# Patient Record
Sex: Male | Born: 1960
Health system: Southern US, Community
[De-identification: ages and names within clinical notes are randomized; demographics above are authoritative.]

## PROBLEM LIST (undated history)

## (undated) DIAGNOSIS — H9190 Unspecified hearing loss, unspecified ear: Secondary | ICD-10-CM

## (undated) DIAGNOSIS — C801 Malignant (primary) neoplasm, unspecified: Secondary | ICD-10-CM

## (undated) DIAGNOSIS — J189 Pneumonia, unspecified organism: Secondary | ICD-10-CM

## (undated) DIAGNOSIS — F419 Anxiety disorder, unspecified: Secondary | ICD-10-CM

## (undated) DIAGNOSIS — R52 Pain, unspecified: Secondary | ICD-10-CM

## (undated) DIAGNOSIS — M5126 Other intervertebral disc displacement, lumbar region: Secondary | ICD-10-CM

## (undated) DIAGNOSIS — M1990 Unspecified osteoarthritis, unspecified site: Secondary | ICD-10-CM

## (undated) DIAGNOSIS — I10 Essential (primary) hypertension: Secondary | ICD-10-CM

## (undated) HISTORY — PX: OTHER SURGICAL HISTORY: SHX169

## (undated) HISTORY — PX: JOINT REPLACEMENT: SHX530

---

## 2002-04-11 ENCOUNTER — Emergency Department (HOSPITAL_COMMUNITY): Admission: EM | Admit: 2002-04-11 | Discharge: 2002-04-11 | Payer: Self-pay | Admitting: Internal Medicine

## 2005-09-15 ENCOUNTER — Ambulatory Visit (HOSPITAL_COMMUNITY): Admission: RE | Admit: 2005-09-15 | Discharge: 2005-09-15 | Payer: Self-pay | Admitting: Sports Medicine

## 2005-10-14 ENCOUNTER — Ambulatory Visit (HOSPITAL_BASED_OUTPATIENT_CLINIC_OR_DEPARTMENT_OTHER): Admission: RE | Admit: 2005-10-14 | Discharge: 2005-10-14 | Payer: Self-pay | Admitting: Orthopedic Surgery

## 2009-02-01 ENCOUNTER — Emergency Department (HOSPITAL_COMMUNITY): Admission: EM | Admit: 2009-02-01 | Discharge: 2009-02-01 | Payer: Self-pay | Admitting: Emergency Medicine

## 2009-03-31 ENCOUNTER — Inpatient Hospital Stay (HOSPITAL_COMMUNITY): Admission: RE | Admit: 2009-03-31 | Discharge: 2009-04-02 | Payer: Self-pay | Admitting: Orthopedic Surgery

## 2009-06-24 ENCOUNTER — Ambulatory Visit (HOSPITAL_COMMUNITY): Admission: RE | Admit: 2009-06-24 | Discharge: 2009-06-24 | Payer: Self-pay | Admitting: Family Medicine

## 2010-01-21 ENCOUNTER — Encounter: Payer: Self-pay | Admitting: Gastroenterology

## 2010-02-09 ENCOUNTER — Ambulatory Visit: Payer: Self-pay | Admitting: Gastroenterology

## 2010-02-09 ENCOUNTER — Ambulatory Visit (HOSPITAL_COMMUNITY): Admission: RE | Admit: 2010-02-09 | Discharge: 2010-02-09 | Payer: Self-pay | Admitting: Gastroenterology

## 2010-02-18 ENCOUNTER — Emergency Department (HOSPITAL_COMMUNITY): Admission: EM | Admit: 2010-02-18 | Discharge: 2010-02-18 | Payer: Self-pay | Admitting: Emergency Medicine

## 2010-05-21 ENCOUNTER — Encounter: Payer: Self-pay | Admitting: Pulmonary Disease

## 2010-05-21 ENCOUNTER — Ambulatory Visit (HOSPITAL_COMMUNITY)
Admission: RE | Admit: 2010-05-21 | Discharge: 2010-05-21 | Payer: Self-pay | Source: Home / Self Care | Attending: Family Medicine | Admitting: Family Medicine

## 2010-07-03 DIAGNOSIS — E785 Hyperlipidemia, unspecified: Secondary | ICD-10-CM | POA: Insufficient documentation

## 2010-07-03 DIAGNOSIS — I10 Essential (primary) hypertension: Secondary | ICD-10-CM | POA: Insufficient documentation

## 2010-07-03 DIAGNOSIS — L259 Unspecified contact dermatitis, unspecified cause: Secondary | ICD-10-CM | POA: Insufficient documentation

## 2010-07-06 ENCOUNTER — Ambulatory Visit
Admission: RE | Admit: 2010-07-06 | Discharge: 2010-07-06 | Payer: Self-pay | Source: Home / Self Care | Attending: Pulmonary Disease | Admitting: Pulmonary Disease

## 2010-07-06 DIAGNOSIS — J984 Other disorders of lung: Secondary | ICD-10-CM | POA: Insufficient documentation

## 2010-07-16 NOTE — Letter (Signed)
Summary: Internal Other Domingo Dimes  Internal Other Domingo Dimes   Imported By: Cloria Spring LPN 82/95/6213 08:65:78  _____________________________________________________________________  External Attachment:    Type:   Image     Comment:   External Document

## 2010-07-30 NOTE — Assessment & Plan Note (Signed)
Summary: consult for pulmonary nodule   Copy to:  Assunta Found Primary Provider/Referring Provider:  Assunta Found  CC:  Pulmonary Consult.  History of Present Illness: The pt is a 50y/o male who I have been asked to see for an abnormal ct chest.  He was noted to have a pulmonary nodule in his RLL by ct chest in 02/2010, and this has been followed up with another ct last month that shows the nodule to be without change.  The pt does have a history of smoking a ppd all of his life, and continues to do so.  He does not have any personal history of cancer.  He has no history of TB exposure, and has never had a positive ppd.  He has never lived in [place] or [place].  He denies any weight loss, chest pain, or hemoptysis.  He has a typical "smokers cough".    Preventive Screening-Counseling & Management  Alcohol-Tobacco     Smoking Status: current  Current Medications (verified): 1)  Xanax 1 Mg Tabs (Alprazolam) .... Take 1/2 Tab By Mouth At Bedtime 2)  Celebrex 200 Mg Caps (Celecoxib) .... Take 1 Tablet By Mouth Once A Day As Needed 3)  Naproxen 500 Mg Tabs (Naproxen) .... Take 1 Tablet By Mouth Two Times A Day As Needed 4)  Benicar Hct 20-12.5 Mg Tabs (Olmesartan Medoxomil-Hctz) .... Take 1 Tablet By Mouth Once A Day  Allergies (verified): 1)  ! * Statin  Past History:  Past Medical History: h/o pna complicated by effusion mid 90's..s/p left vats HYPERTENSION (ICD-401.9) HYPERLIPIDEMIA (ICD-272.4) ECZEMA (ICD-692.9)    Past Surgical History: R shoulder surgery L VATS---per pt due to pna/effusion R hand surgery-thumb L knee surgery Oct 2011  Family History: Reviewed history and no changes required. heart disease: father (m.i.) ---deceased cancer: mother (kidney, breast, stomach) ----deceased  Social History: Reviewed history and no changes required. Patient is a current smoker.  started at age 50.  1 ppd.   pt is married and lives with wife. pt has children. pt works  as a Naval architect.  Smoking Status:  current  Review of Systems       The patient complains of nasal congestion/difficulty breathing through nose and joint stiffness or pain.  The patient denies shortness of breath with activity, shortness of breath at rest, productive cough, non-productive cough, coughing up blood, chest pain, irregular heartbeats, acid heartburn, indigestion, loss of appetite, weight change, abdominal pain, difficulty swallowing, sore throat, tooth/dental problems, headaches, sneezing, itching, ear ache, anxiety, depression, hand/feet swelling, rash, change in color of mucus, and fever.    Vital Signs:  Patient profile:   50 year old male Height:      74 inches Weight:      279.13 pounds BMI:     35.97 O2 Sat:      97 % on Room air Temp:     97.8 degrees F oral Pulse rate:   94 / minute BP sitting:   118 / 80  (left arm) Cuff size:   large  Vitals Entered By: Arman Filter LPN (July 06, 2010 2:32 PM)  O2 Flow:  Room air CC: Pulmonary Consult Comments Medications reviewed with patient Arman Filter LPN  July 06, 2010 2:35 PM    Physical Exam  General:  obese male in nad Eyes:  PERRLA and EOMI.   Nose:  patent without discharge, no purulence seen Mouth:  elongated soft palate and uvula, no exudates. Neck:  no jvd, tmg,  LN Lungs:  totally clear to auscultation Heart:  rrr, no mrg Abdomen:  soft and nontender, bs+ Extremities:  mild edema, no cyanosis pulses intact distally Neurologic:  alert and oriented, moves all 4.   Impression & Recommendations:  Problem # 1:  PULMONARY NODULE, RIGHT LOWER LOBE (ICD-518.89) the pt has a nodule in the RLL that has not changed on his 3 month f/u ct chest.  He is a lifelong smoker, and this needs to be followed very carefully.  He understands this may be benign or may be malignant.  It is borderline size for accuracy with PET scanning.  In  its current state, it would be difficult to bx bronchoscopically or  percutaneously, and I think it is premature to remove it at this point.  It is encouraging the nodule has not changed in 3 mos, but would recommend another followup in 6mos.  The pt is agreeable to this approach, but will call if questions come up or if he wishes to pursue more aggressively.    Medications Added to Medication List This Visit: 1)  Xanax 1 Mg Tabs (Alprazolam) .... Take 1/2 tab by mouth at bedtime  Other Orders: Consultation Level IV (16109)  Patient Instructions: 1)  will need to repeat ct chest first week of June.  Please call me in May to set up.   2)  call if change in your condition, or if you have questions to discuss.

## 2010-08-27 LAB — CBC
HCT: 40.8 % (ref 39.0–52.0)
RDW: 14.7 % (ref 11.5–15.5)
WBC: 6.1 10*3/uL (ref 4.0–10.5)

## 2010-08-27 LAB — BASIC METABOLIC PANEL
BUN: 13 mg/dL (ref 6–23)
GFR calc non Af Amer: >60 mL/min (ref >60)
Potassium: 4 mEq/L (ref 3.5–5.1)
Sodium: 141 mEq/L (ref 135–145)

## 2010-08-27 LAB — DIFFERENTIAL
Basophils Absolute: 0 10*3/uL (ref 0.0–0.1)
Lymphocytes Relative: 38 % (ref 12–46)
Monocytes Absolute: 0.6 10*3/uL (ref 0.1–1.0)
Neutro Abs: 3 10*3/uL (ref 1.7–7.7)

## 2010-08-27 LAB — POCT CARDIAC MARKERS
CKMB, poc: 1.3 ng/mL (ref 1.0–8.0)
Myoglobin, poc: 97.8 ng/mL (ref 12–200)

## 2010-08-27 LAB — D-DIMER, QUANTITATIVE: D-Dimer, Quant: 0.94 ug/mL-FEU — ABNORMAL HIGH (ref 0.00–0.48)

## 2010-09-17 LAB — BASIC METABOLIC PANEL
BUN: 10 mg/dL (ref 6–23)
BUN: 11 mg/dL (ref 6–23)
CO2: 31 mEq/L (ref 19–32)
CO2: 32 mEq/L (ref 19–32)
Calcium: 8.6 mg/dL (ref 8.4–10.5)
Calcium: 8.9 mg/dL (ref 8.4–10.5)
Chloride: 102 mEq/L (ref 96–112)
GFR calc non Af Amer: >60 mL/min (ref >60)
GFR calc non Af Amer: >60 mL/min (ref >60)
Glucose, Bld: 114 mg/dL — ABNORMAL HIGH (ref 70–99)
Glucose, Bld: 99 mg/dL (ref 70–99)
Potassium: 4.9 mEq/L (ref 3.5–5.1)
Potassium: 4 mEq/L (ref 3.5–5.1)
Sodium: 138 mEq/L (ref 135–145)
Sodium: 138 mEq/L (ref 135–145)

## 2010-09-17 LAB — URINALYSIS, ROUTINE W REFLEX MICROSCOPIC
Glucose, UA: NEGATIVE mg/dL
Ketones, ur: NEGATIVE mg/dL
Nitrite: NEGATIVE
Protein, ur: NEGATIVE mg/dL
Urobilinogen, UA: 1 mg/dL (ref 0.0–1.0)

## 2010-09-17 LAB — TYPE AND SCREEN: Antibody Screen: NEGATIVE

## 2010-09-17 LAB — CBC
HCT: 32.7 % — ABNORMAL LOW (ref 39.0–52.0)
HCT: 42.3 % (ref 39.0–52.0)
Hemoglobin: 11.3 g/dL — ABNORMAL LOW (ref 13.0–17.0)
MCHC: 33.4 g/dL (ref 30.0–36.0)
MCHC: 34.4 g/dL (ref 30.0–36.0)
MCV: 87.3 fL (ref 78.0–100.0)
Platelets: 173 10*3/uL (ref 150–400)
Platelets: 202 10*3/uL (ref 150–400)
RDW: 14.2 % (ref 11.5–15.5)
RDW: 14.6 % (ref 11.5–15.5)
RDW: 13.7 % (ref 11.5–15.5)
WBC: 7.5 10*3/uL (ref 4.0–10.5)

## 2010-09-17 LAB — DIFFERENTIAL
Basophils Absolute: 0.1 10*3/uL (ref 0.0–0.1)
Eosinophils Relative: 3 % (ref 0–5)
Lymphocytes Relative: 37 % (ref 12–46)
Lymphs Abs: 2.8 10*3/uL (ref 0.7–4.0)
Neutro Abs: 3.9 10*3/uL (ref 1.7–7.7)

## 2010-09-17 LAB — PROTIME-INR
INR: 0.85 (ref 0.00–1.49)
Prothrombin Time: 11.5 seconds — ABNORMAL LOW (ref 11.6–15.2)

## 2010-09-17 LAB — APTT: aPTT: 28 seconds (ref 24–37)

## 2010-10-30 NOTE — Op Note (Signed)
NAMEDIANE, MOCHIZUKI NO.:  [id number]   MEDICAL RECORD NO.:  [id number]          PATIENT TYPE:  AMB   LOCATION:  DSC                          FACILITY:  [facility]   PHYSICIAN:  Loreta Ave, M.D. DATE OF BIRTH:  1961-02-07   DATE OF PROCEDURE:  10/14/2005  DATE OF DISCHARGE:                                 OPERATIVE REPORT   INDICATIONS FOR PROCEDURE:  A 50 year old male with torn cartilage and  arthritis of the left knee.  Brought to the operating room at this time for  arthroscopic debridement.   PREOPERATIVE DIAGNOSIS:  Left knee medial meniscal tear, chondromalacia  patella.   POSTOPERATIVE DIAGNOSIS:  Left knee medial meniscal tear, chondromalacia  patella with extensive grade 4 chondromalacia patellofemoral joint as well  as grade 2 and 3 chondromalacia of medial and lateral compartments.   OPERATION PERFORMED:  Left knee examination under anesthesia, arthroscopy,  partial medial meniscectomy.  Extensive chondroplasty of patellofemoral  joint and lesser extent other compartments.  Removal of numerous chondral  loose bodies.   SURGEON:  Loreta Ave, M.D.   ASSISTANT:  Genene Churn. Denton Meek.   ANESTHESIA:  General.   ESTIMATED BLOOD LOSS:  Minimal.   SPECIMENS:  None.   CULTURES:  None.   COMPLICATIONS:  None.   DRESSING:  Soft compressive.   TOURNIQUET:  Not employed.   DESCRIPTION OF PROCEDURE:  The patient was brought to the operating room and  placed on the operating table in supine position.  After adequate anesthesia  had been obtained, the knee examined.  2+ reactive infusion.  Tourniquet and  leg holder applied.  Leg prepped and draped in the usual sterile fashion.  Three portals were created, one superolateral, one each medial and lateral  parapatellar.  Inflow catheter introduced.  Knee distended. Arthroscope  introduced.  Extensive grade 3 and grade 4 changes over the entire  patellofemoral joint, bone-on-bone over the lateral  half.  Chondroplasty of  the remaining surfaces.  Numerous chondral loose bodies throughout the knee  debrided.  Cruciate ligaments intact.  Medial and lateral compartments grade  2 and 3 changes on the condyles debrided.  Lateral meniscus intact.  Medial  meniscus complex tearing posterior half taken down to stable rim tapered  into remaining meniscus.  At completion, the entire knee examined top be  sure all loose fragments removed.  Instruments and fluid removed.  Portals of the knee injected with Marcaine.  Portals were closed with 4-0 nylon.  Sterile compressive dressing applied.  Anesthesia reversed.  Brought to recovery room.  Tolerated surgery well.  No  complications.      Loreta Ave, M.D.  Electronically Signed     DFM/MEDQ  D:  10/14/2005  T:  10/15/2005  Job:  [id number]

## 2010-12-15 ENCOUNTER — Telehealth: Payer: Self-pay | Admitting: *Deleted

## 2010-12-15 DIAGNOSIS — R911 Solitary pulmonary nodule: Secondary | ICD-10-CM

## 2010-12-15 NOTE — Telephone Encounter (Signed)
Message copied by Salli Quarry on Tue Dec 15, 2010  5:44 PM ------      Message from: Salli Quarry      Created: Mon Sep 14, 2010  9:33 AM       F/u ct chest for RLL nodule

## 2010-12-18 ENCOUNTER — Encounter: Payer: Self-pay | Admitting: *Deleted

## 2010-12-24 ENCOUNTER — Ambulatory Visit (INDEPENDENT_AMBULATORY_CARE_PROVIDER_SITE_OTHER)
Admission: RE | Admit: 2010-12-24 | Discharge: 2010-12-24 | Disposition: A | Discharge status: Home / Self Care | Payer: Commercial Managed Care - PPO | Source: Ambulatory Visit | Attending: Pulmonary Disease | Admitting: Pulmonary Disease

## 2010-12-24 DIAGNOSIS — J984 Other disorders of lung: Secondary | ICD-10-CM

## 2010-12-24 DIAGNOSIS — R911 Solitary pulmonary nodule: Secondary | ICD-10-CM

## 2010-12-31 ENCOUNTER — Telehealth: Payer: Self-pay | Admitting: Pulmonary Disease

## 2010-12-31 NOTE — Telephone Encounter (Signed)
Please let pt know that his spot is a now more scar like than a rounded spot. This is good news. Has not enlarged. I would recommend one more followup in one year. Have him call us next summer to schedule.      LMTCB

## 2011-01-01 NOTE — Telephone Encounter (Signed)
Called and spoke with pt.  Pt aware of Ct results. Pt verbalized understanding and denied any questions.

## 2011-01-25 ENCOUNTER — Telehealth: Payer: Self-pay | Admitting: Pulmonary Disease

## 2011-01-25 NOTE — Telephone Encounter (Signed)
Error.  Ct already done

## 2012-02-28 ENCOUNTER — Other Ambulatory Visit (HOSPITAL_COMMUNITY): Payer: Self-pay | Admitting: Chiropractic Medicine

## 2012-02-28 DIAGNOSIS — M5430 Sciatica, unspecified side: Secondary | ICD-10-CM

## 2012-02-29 ENCOUNTER — Ambulatory Visit (HOSPITAL_COMMUNITY)
Admission: RE | Admit: 2012-02-29 | Discharge: 2012-02-29 | Disposition: A | Discharge status: Home / Self Care | Payer: BC Managed Care – PPO | Source: Ambulatory Visit | Attending: Chiropractic Medicine | Admitting: Chiropractic Medicine

## 2012-02-29 DIAGNOSIS — M5430 Sciatica, unspecified side: Secondary | ICD-10-CM

## 2012-02-29 DIAGNOSIS — M545 Low back pain, unspecified: Secondary | ICD-10-CM | POA: Insufficient documentation

## 2012-02-29 DIAGNOSIS — M51379 Other intervertebral disc degeneration, lumbosacral region without mention of lumbar back pain or lower extremity pain: Secondary | ICD-10-CM | POA: Insufficient documentation

## 2012-02-29 DIAGNOSIS — M5126 Other intervertebral disc displacement, lumbar region: Secondary | ICD-10-CM | POA: Insufficient documentation

## 2012-02-29 DIAGNOSIS — M5137 Other intervertebral disc degeneration, lumbosacral region: Secondary | ICD-10-CM | POA: Insufficient documentation

## 2013-07-11 ENCOUNTER — Other Ambulatory Visit (HOSPITAL_COMMUNITY): Payer: Self-pay | Admitting: Neurosurgery

## 2013-07-11 DIAGNOSIS — M4120 Other idiopathic scoliosis, site unspecified: Secondary | ICD-10-CM

## 2013-07-13 ENCOUNTER — Ambulatory Visit (HOSPITAL_COMMUNITY)
Admission: RE | Admit: 2013-07-13 | Discharge: 2013-07-13 | Disposition: A | Discharge status: Home / Self Care | Payer: BC Managed Care – PPO | Source: Ambulatory Visit | Attending: Neurosurgery | Admitting: Neurosurgery

## 2013-07-13 DIAGNOSIS — M48061 Spinal stenosis, lumbar region without neurogenic claudication: Secondary | ICD-10-CM | POA: Insufficient documentation

## 2013-07-13 DIAGNOSIS — M5137 Other intervertebral disc degeneration, lumbosacral region: Secondary | ICD-10-CM | POA: Insufficient documentation

## 2013-07-13 DIAGNOSIS — M5126 Other intervertebral disc displacement, lumbar region: Secondary | ICD-10-CM | POA: Insufficient documentation

## 2013-07-13 DIAGNOSIS — M51379 Other intervertebral disc degeneration, lumbosacral region without mention of lumbar back pain or lower extremity pain: Secondary | ICD-10-CM | POA: Insufficient documentation

## 2013-07-13 DIAGNOSIS — M4120 Other idiopathic scoliosis, site unspecified: Secondary | ICD-10-CM | POA: Insufficient documentation

## 2013-08-07 ENCOUNTER — Ambulatory Visit (HOSPITAL_COMMUNITY)
Admission: RE | Admit: 2013-08-07 | Discharge: 2013-08-07 | Disposition: A | Discharge status: Home / Self Care | Payer: BC Managed Care – PPO | Source: Ambulatory Visit | Attending: Neurosurgery | Admitting: Neurosurgery

## 2013-08-07 DIAGNOSIS — M5410 Radiculopathy, site unspecified: Secondary | ICD-10-CM | POA: Insufficient documentation

## 2013-08-07 DIAGNOSIS — M545 Low back pain, unspecified: Secondary | ICD-10-CM | POA: Insufficient documentation

## 2013-08-07 DIAGNOSIS — I10 Essential (primary) hypertension: Secondary | ICD-10-CM | POA: Insufficient documentation

## 2013-08-07 DIAGNOSIS — E785 Hyperlipidemia, unspecified: Secondary | ICD-10-CM | POA: Insufficient documentation

## 2013-08-07 DIAGNOSIS — IMO0001 Reserved for inherently not codable concepts without codable children: Secondary | ICD-10-CM | POA: Insufficient documentation

## 2013-08-07 DIAGNOSIS — R911 Solitary pulmonary nodule: Secondary | ICD-10-CM | POA: Insufficient documentation

## 2013-08-07 NOTE — Evaluation (Signed)
Physical Therapy Evaluation  Patient Details  Name: Roberto Dixon MRN: [id number] Date of Birth: Oct 19, 1960  Today's Date: 08/07/2013 Time: 0810-0924 Roberto Time Calculation (min): 4 min Charge:  Evaluation 810-850; traction 900-920             Visit#: 1 of 4  Re-eval: 08/21/13 Assessment Diagnosis: radicular back pain  Authorization: BCBS     Past Medical History: No past medical history on file. Past Surgical History: No past surgical history on file.  Subjective Symptoms/Limitations Symptoms: Roberto Dixon had back pain for about three years.  He is being seen from therapy to try and prevent surgery.  He states he can handle the back pain but the radiating pain that goes down his right leg when he stands is excrutiating.  He states when he sits his back will begin to ache.   Pertinent History: Lt TKR 2012; Roberto has been seeing a Restaurant manager, fast food for years.   How long can you sit comfortably?: an hour How long can you stand comfortably?: five minutes How long can you walk comfortably?: able to walk fine he just can not stand still Pain Assessment Currently in Pain?: Yes Pain Score:  (will go up to a 10/10) Pain Location: Back Pain Orientation: Lower Pain Radiating Towards: Rigth leg   Prior Functioning  Prior Function Vocation: Full time employment Vocation Requirements: Dixon Leisure: Hobbies-yes (Comment) Comments: race car   Sensation/Coordination/Flexibility/Functional Tests Functional Tests Functional Tests: foto 55  Assessment RLE Strength Right Hip Flexion:  (4+/5) Right Hip Extension: 5/5 Right Hip ABduction: 5/5 Right Hip ADduction: 5/5 Right Knee Flexion: 5/5 Right Knee Extension: 5/5 Right Ankle Dorsiflexion: 4/5 Right Ankle Plantar Flexion: 5/5 LLE Strength Left Hip Flexion: 5/5 Left Hip Extension: 5/5 Left Hip ABduction: 5/5 Left Hip ADduction: 5/5 Left Knee Extension: 5/5 Left Ankle Dorsiflexion: 5/5 Left Ankle Plantar Flexion: 5/5 Lumbar  AROM Lumbar Flexion: decreased 25% Lumbar Extension: wfl Lumbar - Right Side Bend: decreased 20% Lumbar - Left Side Bend: decreased 20% Lumbar - Right Rotation: decreased 20% Lumbar - Left Rotation: decreased 20%  Exercise/Treatments      Stretches Active Hamstring Stretch: 3 reps;30 seconds Single Knee to Chest Stretch: 3 reps;30 seconds Standing Extension: 5 reps   Supine Ab Set: 10 reps Bridge: 10 reps    Modalities Modalities: Traction Manual Therapy Manual Therapy: Other (comment) Traction Type of Traction: Lumbar Max (lbs): 70# Hold Time: static Time: 15 minutes   Physical Therapy Assessment and Plan Roberto Assessment and Plan Clinical Impression Statement: Roberto Dixon with radicular low back pain.  He has been referred to Roberto prior to therapy to see is therapy can give him any relief.  Roberto will benefit from skilled care to improve posture, body mechanics increase core strength and modalities for pain. Roberto will benefit from skilled therapeutic intervention in order to improve on the following deficits: Pain;Decreased activity tolerance;Difficulty walking;Improper body mechanics Rehab Potential: Good Roberto Frequency: Min 2X/week Roberto Duration:  (2 weeks) Roberto Treatment/Interventions: Therapeutic exercise;Functional mobility training;Patient/family education;Modalities Roberto Plan: Roberto to recieve education on posture and body mechanics. core stabiliztion as well as traction to decrease symptoms. Assess how traction did for patient may increase to 80# if postivive results.    Goals Home Exercise Program Roberto/caregiver will Perform Home Exercise Program: For increased ROM;For increased strengthening Roberto Short Term Goals Time to Complete Short Term Goals: 2 weeks Roberto Short Term Goal 1: Roberto pain to go to knee level only when standing Roberto  Short Term Goal 2: Roberto pain level to be at the most 5/10  Roberto Short Term Goal 3: Roberto to be able to verbalize the importance of posture and body  mechanics in back care. NO long term goals as Roberto orders are for two weeks only Problem List Patient Active Problem List   Diagnosis Date Noted  . Radicular low back pain 08/07/2013  . PULMONARY NODULE, RIGHT LOWER LOBE 07/06/2010  . HYPERLIPIDEMIA 07/03/2010  . HYPERTENSION 07/03/2010  . ECZEMA 07/03/2010    Roberto Plan of Care Roberto Home Exercise Plan: given  GP    Roberto Dixon 08/07/2013, 10:58 AM  Physician Documentation Your signature is required to indicate approval of the treatment plan as stated above.  Please sign and either send electronically or make a copy of this report for your files and return this physician signed original.   Please mark one 1.__approve of plan  2. ___approve of plan with the following conditions.   ______________________________                                                          _____________________ Physician Signature                                                                                                             Date

## 2013-08-10 ENCOUNTER — Ambulatory Visit (HOSPITAL_COMMUNITY)
Admission: RE | Admit: 2013-08-10 | Discharge: 2013-08-10 | Disposition: A | Discharge status: Home / Self Care | Payer: BC Managed Care – PPO | Source: Ambulatory Visit | Attending: Family Medicine | Admitting: Family Medicine

## 2013-08-10 DIAGNOSIS — I10 Essential (primary) hypertension: Secondary | ICD-10-CM | POA: Insufficient documentation

## 2013-08-10 DIAGNOSIS — IMO0001 Reserved for inherently not codable concepts without codable children: Secondary | ICD-10-CM | POA: Insufficient documentation

## 2013-08-10 DIAGNOSIS — M545 Low back pain, unspecified: Secondary | ICD-10-CM | POA: Insufficient documentation

## 2013-08-10 DIAGNOSIS — E785 Hyperlipidemia, unspecified: Secondary | ICD-10-CM | POA: Insufficient documentation

## 2013-08-10 DIAGNOSIS — R911 Solitary pulmonary nodule: Secondary | ICD-10-CM | POA: Insufficient documentation

## 2013-08-10 NOTE — Progress Notes (Signed)
Physical Therapy Treatment Patient Details  Name: Roberto Dixon MRN: [id number] Date of Birth: 08-13-1960  Today's Date: 08/10/2013 Time: 1730-1830 PT Time Calculation (min): 60 min Charge: TE 1730-1810, Lumbar traction 1812-1830  Visit#: 2 of 4  Re-eval: 08/21/13 Assessment Diagnosis: radicular back pain  Authorization: BCBS  Authorization Time Period:    Authorization Visit#:   of     Subjective: Symptoms/Limitations Symptoms: Pt reports compliance with HEP daily.  Pt stated initially he was feeling good, reports Rt LE radiculat symptoms increase with standing for long periods of time.   Pain Assessment Currently in Pain?: Yes Pain Score: 8  Pain Location: Back Pain Orientation: Lower;Right Pain Radiating Towards: Right LE  Objective:  Exercise/Treatments Stretches Active Hamstring Stretch: 3 reps;30 seconds Single Knee to Chest Stretch: 3 reps;30 seconds Standing Extension: Limitations Standing Extension Limitations: 10 reps x 10" Standing Functional Squats: 10 reps Seated Other Seated Lumbar Exercises: Cervical retraction, scapular retraction 10x, Wback 10x Other Seated Lumbar Exercises: PFC with tactile cueing and counting outload to continue breathing x 10" Supine Bridge: 10 reps  Modalities Modalities: Traction Traction Type of Traction: Lumbar Max (lbs): 80# Hold Time: static Time: 15 minutes + set up/take down  Physical Therapy Assessment and Plan PT Assessment and Plan Clinical Impression Statement: Began POC to improve posture awareness, core strengthening, educated on proper body mechanics and ended session with static lumbar traction for pain relief.  Utilized visual cueing including spine figure to help understanding with importance of posture.  Pt reported pain free following traction end of session.   PT Plan: Continue with posture and core stabilization exercises with proper body mechanics.  F/u with traction with increased weight.     Goals Home Exercise Program Pt/caregiver will Perform Home Exercise Program: For increased ROM;For increased strengthening PT Short Term Goals Time to Complete Short Term Goals: 2 weeks PT Short Term Goal 1: Pt pain to go to knee level only when standing PT Short Term Goal 1 - Progress: Progressing toward goal PT Short Term Goal 2: Pt pain level to be at the most 5/10  PT Short Term Goal 3: Pt to be able to verbalize the importance of posture and body mechanics in back care. PT Short Term Goal 3 - Progress: Progressing toward goal  Problem List Patient Active Problem List   Diagnosis Date Noted  . Radicular low back pain 08/07/2013  . PULMONARY NODULE, RIGHT LOWER LOBE 07/06/2010  . HYPERLIPIDEMIA 07/03/2010  . HYPERTENSION 07/03/2010  . ECZEMA 07/03/2010    PT - End of Session Activity Tolerance: Patient tolerated treatment well General Behavior During Therapy: [facility] for tasks assessed/performed  GP    Aldona Lento 08/10/2013, 6:42 PM

## 2013-08-14 ENCOUNTER — Ambulatory Visit (HOSPITAL_COMMUNITY)
Admission: RE | Admit: 2013-08-14 | Discharge: 2013-08-14 | Disposition: A | Discharge status: Home / Self Care | Payer: BC Managed Care – PPO | Source: Ambulatory Visit | Attending: Family Medicine | Admitting: Family Medicine

## 2013-08-14 DIAGNOSIS — E785 Hyperlipidemia, unspecified: Secondary | ICD-10-CM | POA: Insufficient documentation

## 2013-08-14 DIAGNOSIS — R911 Solitary pulmonary nodule: Secondary | ICD-10-CM | POA: Insufficient documentation

## 2013-08-14 DIAGNOSIS — M545 Low back pain, unspecified: Secondary | ICD-10-CM | POA: Insufficient documentation

## 2013-08-14 DIAGNOSIS — I10 Essential (primary) hypertension: Secondary | ICD-10-CM | POA: Insufficient documentation

## 2013-08-14 DIAGNOSIS — IMO0001 Reserved for inherently not codable concepts without codable children: Secondary | ICD-10-CM | POA: Insufficient documentation

## 2013-08-14 NOTE — Progress Notes (Signed)
Physical Therapy Treatment Patient Details  Name: Roberto Dixon MRN: [id number] Date of Birth: [birth year]  Today's Date: 08/14/2013 Time: 8756-4332 PT Time Calculation (min): 45 min Charges: Therex x 95'(1884-1660) Traction x 1(1710-1030)  Visit#: 3 of 4  Re-eval: 08/21/13  Authorization: BCBS    Subjective: Symptoms/Limitations Symptoms: Pt states that radicular symptoms are becoming less frequent. Pain Assessment Currently in Pain?: Yes Pain Score: 4  Pain Location: Back Pain Orientation: Lower;Right   Exercise/Treatments Standing Functional Squats: 10 reps Scapular Retraction: 10 reps;Theraband Theraband Level (Scapular Retraction): Level 3 (Green) Row: 10 reps;Theraband Theraband Level (Row): Level 3 (Green) Shoulder Extension: 10 reps;Theraband Theraband Level (Shoulder Extension): Level 3 (Green) Supine Large Ball Abdominal Isometric: 10 reps;5 seconds Large Ball Oblique Isometric: 10 reps;5 seconds  Modalities Modalities: Traction Traction Type of Traction: Lumbar Max (lbs): 85# Hold Time: static Time: 15 minutes + set up/take down  Physical Therapy Assessment and Plan PT Assessment and Plan Clinical Impression Statement: Continued to progress core and postural strength. Pt completes therex well after initial cueing and demo. Continued with mechanical lumbar traction secondary to beneficial results with previous treatments. increased weight with traction 5#. Pt repots pain decrease to 0/10 at end of session. PT Plan: Continue with posture and core stabilization exercises with proper body mechanics.      Problem List Patient Active Problem List   Diagnosis Date Noted  . Radicular low back pain 08/07/2013  . PULMONARY NODULE, RIGHT LOWER LOBE 07/06/2010  . HYPERLIPIDEMIA 07/03/2010  . HYPERTENSION 07/03/2010  . ECZEMA 07/03/2010    PT - End of Session Activity Tolerance: Patient tolerated treatment well General Behavior During Therapy: [facility] for  tasks assessed/performed  Rachelle Hora, PTA  08/14/2013, 5:35 PM

## 2013-08-17 ENCOUNTER — Ambulatory Visit (HOSPITAL_COMMUNITY)
Admission: RE | Admit: 2013-08-17 | Discharge: 2013-08-17 | Disposition: A | Discharge status: Home / Self Care | Payer: BC Managed Care – PPO | Source: Ambulatory Visit

## 2013-08-17 NOTE — Progress Notes (Signed)
Physical Therapy Treatment Patient Details  Name: Roberto Dixon MRN: [id number] Date of Birth: 04-Jan-1961  Today's Date: 08/17/2013 Time: 4496-7591 PT Time Calculation (min): 65 min Charge Self care [phone], TE [phone], Lumbar traction 1815-1835  Visit#: 4 of 4  Re-eval: 08/21/13 Assessment Diagnosis: radicular back pain Next MD Visit: Adolphus Birchwood 08/20/2013 Prior Therapy:     Subjective: Symptoms/Limitations Symptoms: Pt reported that he was doing good, reports increased pain folowing traction last sesson.  Stated pain had become more centralized in back but has returned to Rt hip today folowiong standing for 5 minutes with pain increase to 10/10.   Pain Assessment Currently in Pain?: Yes Pain Score: 10-Worst pain ever Pain Location: Hip Pain Orientation: Right  Objective:   FOTO 67/33 was 55/45  Exercise/Treatments Stretches Active Hamstring Stretch: 3 reps;30 seconds;Limitations Active Hamstring Stretch Limitations: with rope... given HEP printout  Piriformis Stretch: 3 reps;30 seconds;Limitations Piriformis Stretch Limitations: figure 4,,, given HEP Standing Lifting: From floor;10 reps;Weights Lifting Weights (lbs): cueing for form Scapular Retraction: 15 reps;Theraband;Limitations Theraband Level (Scapular Retraction): Level 4 (Blue) Scapular Retraction Limitations: HEP theraband and worksheet Row: 15 reps;Theraband;Limitations Theraband Level (Row): Level 4 (Blue) Row Limitations: HEP theraband and worksheet Shoulder Extension: 15 reps;Theraband;Limitations Theraband Level (Shoulder Extension): Level 4 (Blue) Shoulder Extension Limitations: HEP theraband and worksheet Other Standing Lumbar Exercises: Postural education... pt able to verbalize proper landmarks for appropriate posture  Modalities Modalities: Traction Traction Type of Traction: Lumbar Max (lbs): 80# Hold Time: static Time: 15 minutes + set up/take down  Physical Therapy Assessment and Plan PT  Assessment and Plan Clinical Impression Statement: Reviewed goals per 4th visit and MD apt next Monday 08/20/2013.  Pt reports radicular symptoms have returned following session on Monday, stated he was feeling good prior that session.  Pt compliant with HEP exercises daily, able to demontrate appropriate technique with all exercises without difficutly and reports relief following.  Reviewed importance of proper posture and body mechanics, pt able to verbalize appropriate landmarks for posture and demonstrated proper lifting techniques with min cueing required for form. Pt given HEP worksheet and blue theraband for postural strengthening.  Noted tight hip musculature, added stretches and HEP worksheet given for stretches at home.  Pt reported relief following piriformis stretches.  Ended session wtih lumbar traction to reduce radicular symptom. PT Plan: D/C to HEP, MD apt next Monday 08/20/2013.    Goals Home Exercise Program PT Goal: Perform Home Exercise Program - Progress: Met (3 x a day) PT Short Term Goals PT Short Term Goal 1: Pt pain to go to knee level only when standing PT Short Term Goal 1 - Progress: Not met (radicular symptoms to Rt foot) PT Short Term Goal 2: Pt pain level to be at the most 5/10  PT Short Term Goal 2 - Progress: Progressing toward goal PT Short Term Goal 3: Pt to be able to verbalize the importance of posture and body mechanics in back care. PT Short Term Goal 3 - Progress: Met  Problem List Patient Active Problem List   Diagnosis Date Noted  . Radicular low back pain 08/07/2013  . PULMONARY NODULE, RIGHT LOWER LOBE 07/06/2010  . HYPERLIPIDEMIA 07/03/2010  . HYPERTENSION 07/03/2010  . ECZEMA 07/03/2010    PT - End of Session Activity Tolerance: Patient tolerated treatment well;Patient limited by pain General Behavior During Therapy: WFL for tasks assessed/performed PT Plan of Care PT Home Exercise Plan: HEP given for posutre strengthening and stretches  forLE.  GP    Nickola Major,  Tessie Eke 08/17/2013, 6:56 PM

## 2013-08-21 ENCOUNTER — Ambulatory Visit (HOSPITAL_COMMUNITY): Payer: BC Managed Care – PPO | Admitting: *Deleted

## 2013-09-04 ENCOUNTER — Ambulatory Visit (INDEPENDENT_AMBULATORY_CARE_PROVIDER_SITE_OTHER): Payer: BC Managed Care – PPO | Admitting: Urology

## 2013-09-04 DIAGNOSIS — N401 Enlarged prostate with lower urinary tract symptoms: Secondary | ICD-10-CM

## 2013-09-04 DIAGNOSIS — R972 Elevated prostate specific antigen [PSA]: Secondary | ICD-10-CM

## 2013-09-04 DIAGNOSIS — N138 Other obstructive and reflux uropathy: Secondary | ICD-10-CM

## 2013-09-07 ENCOUNTER — Other Ambulatory Visit: Payer: Self-pay | Admitting: Urology

## 2013-09-07 DIAGNOSIS — R972 Elevated prostate specific antigen [PSA]: Secondary | ICD-10-CM

## 2013-10-02 ENCOUNTER — Other Ambulatory Visit (HOSPITAL_COMMUNITY): Payer: BC Managed Care – PPO

## 2013-10-09 ENCOUNTER — Other Ambulatory Visit: Payer: Self-pay | Admitting: Urology

## 2013-10-09 ENCOUNTER — Ambulatory Visit (HOSPITAL_COMMUNITY)
Admission: RE | Admit: 2013-10-09 | Discharge: 2013-10-09 | Disposition: A | Discharge status: Home / Self Care | Payer: BC Managed Care – PPO | Source: Ambulatory Visit | Attending: Urology | Admitting: Urology

## 2013-10-09 DIAGNOSIS — R972 Elevated prostate specific antigen [PSA]: Secondary | ICD-10-CM

## 2013-10-09 DIAGNOSIS — C61 Malignant neoplasm of prostate: Secondary | ICD-10-CM | POA: Insufficient documentation

## 2013-10-09 MED ORDER — GENTAMICIN SULFATE 40 MG/ML IJ SOLN
160.0000 mg | Freq: Once | INTRAMUSCULAR | Status: AC
  Administered 2013-10-09: 160 mg via INTRAMUSCULAR

## 2013-10-09 MED ORDER — LIDOCAINE HCL (PF) 2 % IJ SOLN
INTRAMUSCULAR | Status: AC
  Administered 2013-10-09: 10 mL
  Filled 2013-10-09: qty 10

## 2013-10-09 MED ORDER — LIDOCAINE HCL (PF) 2 % IJ SOLN
10.0000 mL | Freq: Once | INTRAMUSCULAR | Status: AC
  Administered 2013-10-09: 10 mL

## 2013-10-09 MED ORDER — GENTAMICIN SULFATE 40 MG/ML IJ SOLN
INTRAMUSCULAR | Status: AC
  Administered 2013-10-09: 160 mg via INTRAMUSCULAR
  Filled 2013-10-09: qty 4

## 2013-10-09 NOTE — Discharge Instructions (Signed)
Prostate Biopsy TRUS Biopsy BEFORE THE TEST   Do not take aspirin. Do not take any medicine that has aspirin in it 7 days before your biopsy.  You may be given a medicine to take on the day of your biopsy.  You may also be given a medicine or treatment to help you go poop (laxative or enema). AFTER THE TEST  Only take medicine as told by your doctor.  It is normal to have some bleeding from your rectum for the first 5 days.  You may have blood in your pee (urine) or sperm. Finding out the results of your test Ask when your test results will be ready. Make sure you get your test results. GET HELP RIGHT AWAY IF:  You have a temperature by mouth above 102 F (38.9 C), not controlled by medicine.  You have blood in your pee for more than 5 days.  You have a lot of blood in your pee.  You have bleeding from your rectum for more than 5 days or have a lot of blood in your poop (feces).  You have severe pain. Document Released: 05/19/2009 Document Revised: 08/23/2011 Document Reviewed: 01/17/2013 [facility] Patient Information 2014 [place], [place].

## 2013-10-09 NOTE — Sedation Documentation (Signed)
Biopsy complete no signs of distress  

## 2013-10-16 ENCOUNTER — Ambulatory Visit: Payer: BC Managed Care – PPO | Admitting: Urology

## 2013-10-17 ENCOUNTER — Encounter: Payer: Self-pay | Admitting: Urology

## 2013-11-16 ENCOUNTER — Institutional Professional Consult (permissible substitution) (INDEPENDENT_AMBULATORY_CARE_PROVIDER_SITE_OTHER): Payer: BC Managed Care – PPO | Admitting: Urology

## 2013-11-16 DIAGNOSIS — C61 Malignant neoplasm of prostate: Secondary | ICD-10-CM

## 2013-11-21 ENCOUNTER — Other Ambulatory Visit: Payer: Self-pay | Admitting: Urology

## 2014-01-11 ENCOUNTER — Encounter (HOSPITAL_COMMUNITY)
Admission: RE | Admit: 2014-01-11 | Discharge: 2014-01-11 | Disposition: A | Discharge status: Home / Self Care | Payer: BC Managed Care – PPO | Source: Ambulatory Visit | Attending: Urology | Admitting: Urology

## 2014-01-11 ENCOUNTER — Ambulatory Visit (INDEPENDENT_AMBULATORY_CARE_PROVIDER_SITE_OTHER): Payer: BC Managed Care – PPO | Admitting: Urology

## 2014-01-11 ENCOUNTER — Ambulatory Visit (HOSPITAL_COMMUNITY)
Admission: RE | Admit: 2014-01-11 | Discharge: 2014-01-11 | Disposition: A | Discharge status: Home / Self Care | Payer: BC Managed Care – PPO | Source: Ambulatory Visit | Attending: Anesthesiology | Admitting: Anesthesiology

## 2014-01-11 ENCOUNTER — Encounter (HOSPITAL_COMMUNITY): Payer: Self-pay

## 2014-01-11 ENCOUNTER — Encounter (HOSPITAL_COMMUNITY): Payer: Self-pay | Admitting: Pharmacy Technician

## 2014-01-11 DIAGNOSIS — Z01818 Encounter for other preprocedural examination: Secondary | ICD-10-CM | POA: Insufficient documentation

## 2014-01-11 DIAGNOSIS — I10 Essential (primary) hypertension: Secondary | ICD-10-CM | POA: Insufficient documentation

## 2014-01-11 DIAGNOSIS — C61 Malignant neoplasm of prostate: Secondary | ICD-10-CM | POA: Insufficient documentation

## 2014-01-11 HISTORY — DX: Anxiety disorder, unspecified: F41.9

## 2014-01-11 HISTORY — DX: Malignant (primary) neoplasm, unspecified: C80.1

## 2014-01-11 HISTORY — DX: Other intervertebral disc displacement, lumbar region: M51.26

## 2014-01-11 HISTORY — DX: Pneumonia, unspecified organism: J18.9

## 2014-01-11 HISTORY — DX: Unspecified osteoarthritis, unspecified site: M19.90

## 2014-01-11 HISTORY — DX: Essential (primary) hypertension: I10

## 2014-01-11 HISTORY — DX: Pain, unspecified: R52

## 2014-01-11 LAB — BASIC METABOLIC PANEL
ANION GAP: 11 (ref 5–15)
BUN: 14 mg/dL (ref 6–23)
CHLORIDE: 100 meq/L (ref 96–112)
CO2: 27 meq/L (ref 19–32)
CREATININE: 1 mg/dL (ref 0.50–1.35)
Calcium: 9.6 mg/dL (ref 8.4–10.5)
GFR calc Af Amer: >90 mL/min (ref >90)
GFR calc non Af Amer: 83 mL/min — ABNORMAL LOW (ref >90)
Glucose, Bld: 94 mg/dL (ref 70–99)
Potassium: 4.8 mEq/L (ref 3.7–5.3)
Sodium: 138 mEq/L (ref 137–147)

## 2014-01-11 LAB — CBC
HEMATOCRIT: 42.4 % (ref 39.0–52.0)
HEMOGLOBIN: 15 g/dL (ref 13.0–17.0)
MCH: 29.6 pg (ref 26.0–34.0)
MCHC: 35.4 g/dL (ref 30.0–36.0)
MCV: 83.8 fL (ref 78.0–100.0)
Platelets: 190 10*3/uL (ref 150–400)
RBC: 5.06 MIL/uL (ref 4.22–5.81)
RDW: 14 % (ref 11.5–15.5)
WBC: 8.9 10*3/uL (ref 4.0–10.5)

## 2014-01-11 NOTE — Pre-Procedure Instructions (Signed)
EKG AND CXR WERE DONE TODAY - PREOP AT [facility]. 

## 2014-01-11 NOTE — Patient Instructions (Addendum)
FOLLOW YOUR PREP INSTRUCTIONS DAY BEFORE SURGERY - INSTRUCTIONS WERE FROM DR. WRENN'S OFFICE.  YOUR SURGERY IS SCHEDULED AT [facility]  ON: Wednesday  8/12  REPORT TO  SHORT STAY CENTER AT:  10:45 AM   PLEASE COME IN THE [place] ENTRANCE AND FOLLOW SIGNS TO SHORT STAY CENTER.  DO NOT EAT ANYTHING AFTER MIDNIGHT THE NIGHT BEFORE YOUR SURGERY.   NO FOOD, NO CHEWING GUM, NO MINTS, NO CANDIES, NO CHEWING TOBACCO. YOU MAY HAVE CLEAR LIQUIDS TO DRINK FROM MIDNIGHT THE NIGHT BEFORE SURGERY - UNTIL 6:45 AM DAY OF SURGERY - LIKE WATER, 7- UP, APPLE OR GRAPE OR CRANBERRY JUICE.   NOTHING TO DRINK AFTER 6:45 AM DAY OF SURGERY.  PLEASE TAKE THE FOLLOWING MEDICATIONS THE AM OF YOUR SURGERY WITH A FEW SIPS OF WATER:  ALPRAZOLAM IF NEEDED FOR ANXIETY   DO NOT BRING VALUABLES, MONEY, CREDIT CARDS.  DO NOT WEAR JEWELRY, MAKE-UP, NAIL POLISH AND NO METAL PINS OR CLIPS IN YOUR HAIR. CONTACT LENS, DENTURES / PARTIALS, GLASSES SHOULD NOT BE WORN TO SURGERY AND IN MOST CASES-HEARING AIDS WILL NEED TO BE REMOVED.  BRING YOUR GLASSES CASE, ANY EQUIPMENT NEEDED FOR YOUR CONTACT LENS. FOR PATIENTS ADMITTED TO THE HOSPITAL--CHECK OUT TIME THE DAY OF DISCHARGE IS 11:00 AM.  ALL INPATIENT ROOMS ARE PRIVATE - WITH BATHROOM, TELEPHONE, TELEVISION AND WIFI INTERNET.                                                    PLEASE READ OVER ANY  FACT SHEETS THAT YOU WERE GIVEN: MRSA INFORMATION, BLOOD TRANSFUSION INFORMATION, INCENTIVE SPIROMETER INFORMATION.  PLEASE BE AWARE THAT YOU MAY NEED ADDITIONAL BLOOD DRAWN DAY OF YOUR SURGERY  _______________________________________________________________________   [facility] - Preparing for Surgery Before surgery, you can play an important role.  Because skin is not sterile, your skin needs to be as free of germs as possible.  You can reduce the number of germs on your skin by washing with CHG (chlorahexidine gluconate) soap before surgery.  CHG is an  antiseptic cleaner which kills germs and bonds with the skin to continue killing germs even after washing. Please DO NOT use if you have an allergy to CHG or antibacterial soaps.  If your skin becomes reddened/irritated stop using the CHG and inform your nurse when you arrive at Short Stay. Do not shave (including legs and underarms) for at least 48 hours prior to the first CHG shower.  You may shave your face/neck. Please follow these instructions carefully:  1.  Shower with CHG Soap the night before surgery and the  morning of Surgery.  2.  If you choose to wash your hair, wash your hair first as usual with your  normal  shampoo.  3.  After you shampoo, rinse your hair and body thoroughly to remove the  shampoo.                           4.  Use CHG as you would any other liquid soap.  You can apply chg directly  to the skin and wash                       Gently with a scrungie or clean washcloth.  5.  Apply the  CHG Soap to your body ONLY FROM THE NECK DOWN.   Do not use on face/ open                           Wound or open sores. Avoid contact with eyes, ears mouth and genitals (private parts).                       Wash face,  Genitals (private parts) with your normal soap.             6.  Wash thoroughly, paying special attention to the area where your surgery  will be performed.  7.  Thoroughly rinse your body with warm water from the neck down.  8.  DO NOT shower/wash with your normal soap after using and rinsing off  the CHG Soap.                9.  Pat yourself dry with a clean towel.            10.  Wear clean pajamas.            11.  Place clean sheets on your bed the night of your first shower and do not  sleep with pets. Day of Surgery : Do not apply any lotions/deodorants the morning of surgery.  Please wear clean clothes to the hospital/surgery center.  FAILURE TO FOLLOW THESE INSTRUCTIONS MAY RESULT IN THE CANCELLATION OF YOUR SURGERY PATIENT  SIGNATURE_________________________________  NURSE SIGNATURE__________________________________  ________________________________________________________________________  WHAT IS A BLOOD TRANSFUSION? Blood Transfusion Information  A transfusion is the replacement of blood or some of its parts. Blood is made up of multiple cells which provide different functions.  Red blood cells carry oxygen and are used for blood loss replacement.  White blood cells fight against infection.  Platelets control bleeding.  Plasma helps clot blood.  Other blood products are available for specialized needs, such as hemophilia or other clotting disorders. BEFORE THE TRANSFUSION  Who gives blood for transfusions?   Healthy volunteers who are fully evaluated to make sure their blood is safe. This is blood bank blood. Transfusion therapy is the safest it has ever been in the practice of medicine. Before blood is taken from a donor, a complete history is taken to make sure that person has no history of diseases nor engages in risky social behavior (examples are intravenous drug use or sexual activity with multiple partners). The donor's travel history is screened to minimize risk of transmitting infections, such as malaria. The donated blood is tested for signs of infectious diseases, such as HIV and hepatitis. The blood is then tested to be sure it is compatible with you in order to minimize the chance of a transfusion reaction. If you or a relative donates blood, this is often done in anticipation of surgery and is not appropriate for emergency situations. It takes many days to process the donated blood. RISKS AND COMPLICATIONS Although transfusion therapy is very safe and saves many lives, the main dangers of transfusion include:   Getting an infectious disease.  Developing a transfusion reaction. This is an allergic reaction to something in the blood you were given. Every precaution is taken to prevent  this. The decision to have a blood transfusion has been considered carefully by your caregiver before blood is given. Blood is not given unless the benefits outweigh the risks. AFTER THE TRANSFUSION  Right after receiving a blood  transfusion, you will usually feel much better and more energetic. This is especially true if your red blood cells have gotten low (anemic). The transfusion raises the level of the red blood cells which carry oxygen, and this usually causes an energy increase.  The nurse administering the transfusion will monitor you carefully for complications. HOME CARE INSTRUCTIONS  No special instructions are needed after a transfusion. You may find your energy is better. Speak with your caregiver about any limitations on activity for underlying diseases you may have. SEEK MEDICAL CARE IF:   Your condition is not improving after your transfusion.  You develop redness or irritation at the intravenous (IV) site. SEEK IMMEDIATE MEDICAL CARE IF:  Any of the following symptoms occur over the next 12 hours:  Shaking chills.  You have a temperature by mouth above 102 F (38.9 C), not controlled by medicine.  Chest, back, or muscle pain.  People around you feel you are not acting correctly or are confused.  Shortness of breath or difficulty breathing.  Dizziness and fainting.  You get a rash or develop hives.  You have a decrease in urine output.  Your urine turns a dark color or changes to pink, red, or brown. Any of the following symptoms occur over the next 10 days:  You have a temperature by mouth above 102 F (38.9 C), not controlled by medicine.  Shortness of breath.  Weakness after normal activity.  The white part of the eye turns yellow (jaundice).  You have a decrease in the amount of urine or are urinating less often.  Your urine turns a dark color or changes to pink, red, or brown. Document Released: 05/28/2000 Document Revised: 08/23/2011 Document  Reviewed: 01/15/2008 [facility] Patient Information 2014 [place], [place].  _______________________________________________________________________

## 2014-01-11 NOTE — Progress Notes (Signed)
01/11/14 1130  OBSTRUCTIVE SLEEP APNEA  Have you ever been diagnosed with sleep apnea through a sleep study? No  Do you snore loudly (loud enough to be heard through closed doors)?  0  Do you often feel tired, fatigued, or sleepy during the daytime? 0  Has anyone observed you stop breathing during your sleep? 0  Do you have, or are you being treated for high blood pressure? 1  BMI more than 35 kg/m2? 0  Age over 54 years old? 1  Neck circumference greater than 40 cm/16 inches? 1  Gender: 1  Obstructive Sleep Apnea Score 4  Score 4 or greater  Results sent to PCP

## 2014-01-21 NOTE — H&P (Signed)
Active Problems  1. Adenocarcinoma of prostate (185)  2. Benign prostatic hyperplasia with urinary obstruction (600.01,599.69)  3. Elevated PSA (790.93)  4. Muscle weakness (728.87)  5. Renal cysts, acquired, bilateral (593.2)  6. Rt flank pain (789.09)  History of Present Illness   Mr. Roberto Dixon returns today for a preop visit prior to RALP.   He has a T1c Nx Mx Gleason 6 prostate cancer with 3/12 cores positive with 40% involvement of the left medial apical core.  His SHIM was 16 but he reports normal function today.  His IPSS was 5.   His prostate is 60ml and his CAPRA score is 1.   He has low risk prostate cancer.   He has elected surgical therapy.  He has no associated signs or symptoms.   Past Medical History  1. History of arthritis (V13.4)  2. History of hypertension (V12.59)  Surgical History  1. History of Chest Tube Insertion  2. History of Knee Surgery  3. History of Shoulder Surgery  Current Meds  1. ALPRAZolam 1 MG Oral Tablet;  Therapy: [id number] to Recorded  2. Benicar TABS;  Therapy: (Recorded:24Mar2015) to Recorded  3. Cyclobenzaprine HCl - 10 MG Oral Tablet;  Therapy: [phone] to Recorded  4. Flomax 0.4 MG Oral Capsule;  Therapy: (Recorded:24Mar2015) to Recorded  5. Gentamicin Sulfate 40 MG/ML Injection Solution; INJECT 160  MG Intramuscular before  procedure; To Be Done: With next appointment; Status: HOLD FOR - Administration  Ordered  6. Naproxen TABS;  Therapy: (Recorded:24Mar2015) to Recorded  Allergies  1. No Known Drug Allergies  Family History  1. Family history of Death of family member : Father, Mother  Social History  1. Activities of daily living (ADL's), independent  2. Caffeine use (V49.89)   1-2 cups  3. Exercise habits   No gym exercise at this time but is quite active on the job and also doesn't daily     exercises for his low back at home.  4. Married  5. Never a smoker  6. Occupation  Review of Systems Genitourinary,  constitutional, skin, eye, otolaryngeal, hematologic/lymphatic, cardiovascular, pulmonary, endocrine, musculoskeletal, gastrointestinal, neurological and psychiatric system(s) were reviewed and pertinent findings if present are noted.  Cardiovascular: no chest pain.  Respiratory: no shortness of breath.  Musculoskeletal: joint pain.    Vitals Vital Signs [Data Includes: Last 1 Day]  Recorded: [id number] 08:50AM  Blood Pressure: 137 / 85 Temperature: 97.4 F Heart Rate: 66  Physical Exam Constitutional: Well nourished and well developed . No acute distress.  Pulmonary: No respiratory distress and normal respiratory rhythm and effort.  Cardiovascular: Heart rate and rhythm are normal . No peripheral edema.    Results/Data Urine [Data Includes: Last 1 Day]   [id number]  COLOR STRAW   APPEARANCE CLEAR   SPECIFIC GRAVITY <1.005   pH 6.0   GLUCOSE NEG mg/dL  BILIRUBIN NEG   KETONE NEG mg/dL  BLOOD TRACE   PROTEIN NEG mg/dL  UROBILINOGEN 0.2 mg/dL  NITRITE NEG   LEUKOCYTE ESTERASE NEG   SQUAMOUS EPITHELIAL/HPF RARE   WBC 0-2 WBC/hpf  RBC 0-2 RBC/hpf  BACTERIA NONE SEEN   CRYSTALS NONE SEEN   CASTS NONE SEEN    Assessment  1. Adenocarcinoma of prostate (185)  2. Benign prostatic hyperplasia with urinary obstruction (600.01,599.69)   T1c low risk prostate cancer with minimal ED and mild LUTs.   Plan Adenocarcinoma of prostate   1. Follow-up Keep Future Appt Office  Follow-up  Status: Hold For - Appointment  Requested  for: 31Jul2015 Benign prostatic hyperplasia with urinary obstruction   2. UA With REFLEX; [Do Not Release]; Status:Resulted - Requires Verification;   Done:  [id number] 08:54AM   He has no more questions and we will proceed with surgery on 8/12 as planned.   Discussion/Summary   CC: Dr. Sharilyn Sites and Dr. Claudia Desanctis.

## 2014-01-23 ENCOUNTER — Encounter (HOSPITAL_COMMUNITY)
Admission: RE | Disposition: A | Discharge status: Home / Self Care | Payer: Self-pay | Source: Ambulatory Visit | Attending: Urology

## 2014-01-23 ENCOUNTER — Encounter (HOSPITAL_COMMUNITY): Payer: BC Managed Care – PPO | Admitting: Anesthesiology

## 2014-01-23 ENCOUNTER — Inpatient Hospital Stay (HOSPITAL_COMMUNITY)
Admission: RE | Admit: 2014-01-23 | Discharge: 2014-01-24 | DRG: 708 | Disposition: A | Discharge status: Home / Self Care | Payer: BC Managed Care – PPO | Source: Ambulatory Visit | Attending: Urology | Admitting: Urology

## 2014-01-23 ENCOUNTER — Encounter (HOSPITAL_COMMUNITY): Payer: Self-pay | Admitting: *Deleted

## 2014-01-23 ENCOUNTER — Inpatient Hospital Stay (HOSPITAL_COMMUNITY): Payer: BC Managed Care – PPO | Admitting: Anesthesiology

## 2014-01-23 DIAGNOSIS — F17200 Nicotine dependence, unspecified, uncomplicated: Secondary | ICD-10-CM | POA: Diagnosis present

## 2014-01-23 DIAGNOSIS — N281 Cyst of kidney, acquired: Secondary | ICD-10-CM | POA: Diagnosis present

## 2014-01-23 DIAGNOSIS — C61 Malignant neoplasm of prostate: Secondary | ICD-10-CM | POA: Diagnosis present

## 2014-01-23 DIAGNOSIS — I10 Essential (primary) hypertension: Secondary | ICD-10-CM | POA: Diagnosis present

## 2014-01-23 HISTORY — PX: ROBOT ASSISTED LAPAROSCOPIC RADICAL PROSTATECTOMY: SHX5141

## 2014-01-23 LAB — TYPE AND SCREEN
ABO/RH(D): A NEG
ANTIBODY SCREEN: NEGATIVE

## 2014-01-23 LAB — HEMOGLOBIN AND HEMATOCRIT, BLOOD
HCT: 38.9 % — ABNORMAL LOW (ref 39.0–52.0)
Hemoglobin: 13.5 g/dL (ref 13.0–17.0)

## 2014-01-23 SURGERY — ROBOTIC ASSISTED LAPAROSCOPIC RADICAL PROSTATECTOMY
Anesthesia: General | Laterality: Bilateral

## 2014-01-23 MED ORDER — CEFAZOLIN SODIUM-DEXTROSE 2-3 GM-% IV SOLR
2.0000 g | INTRAVENOUS | Status: AC
  Administered 2014-01-23: 2 g via INTRAVENOUS

## 2014-01-23 MED ORDER — OXYCODONE-ACETAMINOPHEN 5-325 MG PO TABS: 1.0000 | ORAL_TABLET | Freq: Four times a day (QID) | ORAL | Status: DC | PRN

## 2014-01-23 MED ORDER — NEOSTIGMINE METHYLSULFATE 10 MG/10ML IV SOLN
INTRAVENOUS | Status: DC | PRN
  Administered 2014-01-23: 4 mg via INTRAVENOUS

## 2014-01-23 MED ORDER — FENTANYL CITRATE 0.05 MG/ML IJ SOLN
INTRAMUSCULAR | Status: AC
  Filled 2014-01-23: qty 2

## 2014-01-23 MED ORDER — HYDROMORPHONE HCL PF 1 MG/ML IJ SOLN
INTRAMUSCULAR | Status: DC | PRN
  Administered 2014-01-23 (×4): 0.5 mg via INTRAVENOUS

## 2014-01-23 MED ORDER — DEXAMETHASONE SODIUM PHOSPHATE 10 MG/ML IJ SOLN
INTRAMUSCULAR | Status: DC | PRN
  Administered 2014-01-23: 10 mg via INTRAVENOUS

## 2014-01-23 MED ORDER — ALPRAZOLAM 1 MG PO TABS
1.0000 mg | ORAL_TABLET | Freq: Every evening | ORAL | Status: DC | PRN
  Administered 2014-01-23: 1 mg via ORAL
  Filled 2014-01-23: qty 1

## 2014-01-23 MED ORDER — LIDOCAINE HCL (CARDIAC) 20 MG/ML IV SOLN
INTRAVENOUS | Status: AC
  Filled 2014-01-23: qty 5

## 2014-01-23 MED ORDER — LACTATED RINGERS IV SOLN
INTRAVENOUS | Status: DC | PRN
  Administered 2014-01-23: 15:00:00

## 2014-01-23 MED ORDER — BUPIVACAINE-EPINEPHRINE (PF) 0.25% -1:200000 IJ SOLN
INTRAMUSCULAR | Status: AC
  Filled 2014-01-23: qty 30

## 2014-01-23 MED ORDER — CISATRACURIUM BESYLATE (PF) 10 MG/5ML IV SOLN
INTRAVENOUS | Status: DC | PRN
  Administered 2014-01-23 (×3): 4 mg via INTRAVENOUS
  Administered 2014-01-23: 12 mg via INTRAVENOUS

## 2014-01-23 MED ORDER — GLYCOPYRROLATE 0.2 MG/ML IJ SOLN
INTRAMUSCULAR | Status: AC
  Filled 2014-01-23: qty 1

## 2014-01-23 MED ORDER — GLYCOPYRROLATE 0.2 MG/ML IJ SOLN
INTRAMUSCULAR | Status: AC
  Filled 2014-01-23: qty 3

## 2014-01-23 MED ORDER — BUPIVACAINE-EPINEPHRINE 0.25% -1:200000 IJ SOLN
INTRAMUSCULAR | Status: DC | PRN
Start: 2014-01-23 — End: 2014-01-23
  Administered 2014-01-23: 30 mL

## 2014-01-23 MED ORDER — LACTATED RINGERS IV SOLN: INTRAVENOUS | Status: DC

## 2014-01-23 MED ORDER — LACTATED RINGERS IV SOLN
INTRAVENOUS | Status: DC | PRN
  Administered 2014-01-23 (×2): via INTRAVENOUS

## 2014-01-23 MED ORDER — PROMETHAZINE HCL 25 MG/ML IJ SOLN: 6.2500 mg | INTRAMUSCULAR | Status: DC | PRN

## 2014-01-23 MED ORDER — GLYCOPYRROLATE 0.2 MG/ML IJ SOLN
INTRAMUSCULAR | Status: DC | PRN
  Administered 2014-01-23: 0.6 mg via INTRAVENOUS

## 2014-01-23 MED ORDER — OXYCODONE-ACETAMINOPHEN 5-325 MG PO TABS
1.0000 | ORAL_TABLET | ORAL | Status: DC | PRN
  Administered 2014-01-23: 2 via ORAL
  Administered 2014-01-24: 1 via ORAL
  Administered 2014-01-24: 2 via ORAL
  Filled 2014-01-23 (×2): qty 2
  Filled 2014-01-23: qty 1

## 2014-01-23 MED ORDER — FENTANYL CITRATE 0.05 MG/ML IJ SOLN
INTRAMUSCULAR | Status: AC
  Filled 2014-01-23: qty 5

## 2014-01-23 MED ORDER — HYDROMORPHONE HCL PF 1 MG/ML IJ SOLN
INTRAMUSCULAR | Status: AC
  Filled 2014-01-23: qty 1

## 2014-01-23 MED ORDER — CEFAZOLIN SODIUM-DEXTROSE 2-3 GM-% IV SOLR
INTRAVENOUS | Status: AC
  Filled 2014-01-23: qty 50

## 2014-01-23 MED ORDER — DEXAMETHASONE SODIUM PHOSPHATE 10 MG/ML IJ SOLN
INTRAMUSCULAR | Status: AC
  Filled 2014-01-23: qty 1

## 2014-01-23 MED ORDER — DEXTROSE-NACL 5-0.45 % IV SOLN
INTRAVENOUS | Status: DC
  Administered 2014-01-23: 17:00:00 via INTRAVENOUS

## 2014-01-23 MED ORDER — SODIUM CHLORIDE 0.9 % IR SOLN
Status: DC | PRN
  Administered 2014-01-23: 1000 mL via INTRAVESICAL

## 2014-01-23 MED ORDER — CISATRACURIUM BESYLATE 20 MG/10ML IV SOLN
INTRAVENOUS | Status: AC
  Filled 2014-01-23: qty 10

## 2014-01-23 MED ORDER — MIDAZOLAM HCL 2 MG/2ML IJ SOLN
INTRAMUSCULAR | Status: AC
  Filled 2014-01-23: qty 2

## 2014-01-23 MED ORDER — ONDANSETRON HCL 4 MG/2ML IJ SOLN
INTRAMUSCULAR | Status: AC
  Filled 2014-01-23: qty 2

## 2014-01-23 MED ORDER — HYDROMORPHONE HCL PF 2 MG/ML IJ SOLN
INTRAMUSCULAR | Status: AC
  Filled 2014-01-23: qty 1

## 2014-01-23 MED ORDER — PROPOFOL 10 MG/ML IV BOLUS
INTRAVENOUS | Status: AC
  Filled 2014-01-23: qty 20

## 2014-01-23 MED ORDER — IRBESARTAN 150 MG PO TABS
150.0000 mg | ORAL_TABLET | Freq: Every day | ORAL | Status: DC
  Administered 2014-01-24: 150 mg via ORAL
  Filled 2014-01-23: qty 1

## 2014-01-23 MED ORDER — ACETAMINOPHEN 325 MG PO TABS: 650.0000 mg | ORAL_TABLET | ORAL | Status: DC | PRN

## 2014-01-23 MED ORDER — HYDROMORPHONE HCL PF 1 MG/ML IJ SOLN
0.5000 mg | INTRAMUSCULAR | Status: DC | PRN
  Administered 2014-01-24: 1 mg via INTRAVENOUS
  Filled 2014-01-23: qty 1

## 2014-01-23 MED ORDER — FENTANYL CITRATE 0.05 MG/ML IJ SOLN
INTRAMUSCULAR | Status: DC | PRN
  Administered 2014-01-23 (×4): 50 ug via INTRAVENOUS
  Administered 2014-01-23: 100 ug via INTRAVENOUS
  Administered 2014-01-23: 50 ug via INTRAVENOUS

## 2014-01-23 MED ORDER — MIDAZOLAM HCL 5 MG/5ML IJ SOLN
INTRAMUSCULAR | Status: DC | PRN
  Administered 2014-01-23 (×2): 2 mg via INTRAVENOUS

## 2014-01-23 MED ORDER — CETYLPYRIDINIUM CHLORIDE 0.05 % MT LIQD
7.0000 mL | Freq: Two times a day (BID) | OROMUCOSAL | Status: DC
  Administered 2014-01-23 – 2014-01-24 (×2): 7 mL via OROMUCOSAL

## 2014-01-23 MED ORDER — NEOSTIGMINE METHYLSULFATE 10 MG/10ML IV SOLN
INTRAVENOUS | Status: AC
  Filled 2014-01-23: qty 1

## 2014-01-23 MED ORDER — PROPOFOL 10 MG/ML IV BOLUS
INTRAVENOUS | Status: DC | PRN
  Administered 2014-01-23: 200 mg via INTRAVENOUS

## 2014-01-23 MED ORDER — ONDANSETRON HCL 4 MG/2ML IJ SOLN
INTRAMUSCULAR | Status: DC | PRN
  Administered 2014-01-23: 4 mg via INTRAVENOUS

## 2014-01-23 MED ORDER — SODIUM CHLORIDE 0.9 % IV BOLUS (SEPSIS): 1000.0000 mL | Freq: Once | INTRAVENOUS | Status: DC

## 2014-01-23 MED ORDER — CIPROFLOXACIN HCL 500 MG PO TABS: 500.0000 mg | ORAL_TABLET | Freq: Two times a day (BID) | ORAL | Status: DC

## 2014-01-23 MED ORDER — HYDROMORPHONE HCL PF 1 MG/ML IJ SOLN
0.2500 mg | INTRAMUSCULAR | Status: DC | PRN
  Administered 2014-01-23 (×3): 0.5 mg via INTRAVENOUS

## 2014-01-23 MED ORDER — LIDOCAINE HCL (PF) 2 % IJ SOLN
INTRAMUSCULAR | Status: DC | PRN
  Administered 2014-01-23: 75 mg via INTRADERMAL

## 2014-01-23 MED ORDER — HEPARIN SODIUM (PORCINE) 1000 UNIT/ML IJ SOLN
INTRAMUSCULAR | Status: AC
  Filled 2014-01-23: qty 1

## 2014-01-23 SURGICAL SUPPLY — 43 items
CABLE HIGH FREQUENCY MONO STRZ (ELECTRODE) ×3 IMPLANT
CATH FOLEY 2WAY SLVR 18FR 30CC (CATHETERS) ×3 IMPLANT
CATH ROBINSON RED A/P 16FR (CATHETERS) ×3 IMPLANT
CATH TIEMANN FOLEY 18FR 5CC (CATHETERS) ×3 IMPLANT
CHLORAPREP W/TINT 26ML (MISCELLANEOUS) ×3 IMPLANT
CLOTH BEACON ORANGE TIMEOUT ST (SAFETY) ×3 IMPLANT
COVER SURGICAL LIGHT HANDLE (MISCELLANEOUS) ×3 IMPLANT
COVER TIP SHEARS 8 DVNC (MISCELLANEOUS) ×1 IMPLANT
COVER TIP SHEARS 8MM DA VINCI (MISCELLANEOUS) ×2
CUTTER ECHEON FLEX ENDO 45 340 (ENDOMECHANICALS) ×3 IMPLANT
DECANTER SPIKE VIAL GLASS SM (MISCELLANEOUS) IMPLANT
DERMABOND ADVANCED (GAUZE/BANDAGES/DRESSINGS) ×2
DERMABOND ADVANCED .7 DNX12 (GAUZE/BANDAGES/DRESSINGS) ×1 IMPLANT
DRAPE SURG IRRIG POUCH 19X23 (DRAPES) ×3 IMPLANT
DRSG TEGADERM 2-3/8X2-3/4 SM (GAUZE/BANDAGES/DRESSINGS) IMPLANT
DRSG TEGADERM 4X4.75 (GAUZE/BANDAGES/DRESSINGS) IMPLANT
DRSG TEGADERM 6X8 (GAUZE/BANDAGES/DRESSINGS) ×6 IMPLANT
ELECT REM PT RETURN 9FT ADLT (ELECTROSURGICAL) ×3
ELECTRODE REM PT RTRN 9FT ADLT (ELECTROSURGICAL) ×1 IMPLANT
GLOVE BIO SURGEON STRL SZ 6.5 (GLOVE) ×2 IMPLANT
GLOVE BIO SURGEONS STRL SZ 6.5 (GLOVE) ×1
GLOVE SURG SS PI 8.0 STRL IVOR (GLOVE) ×18 IMPLANT
GOWN STRL REUS W/TWL LRG LVL3 (GOWN DISPOSABLE) ×18 IMPLANT
HOLDER FOLEY CATH W/STRAP (MISCELLANEOUS) ×3 IMPLANT
IV LACTATED RINGERS 1000ML (IV SOLUTION) IMPLANT
KIT ACCESSORY DA VINCI DISP (KITS) ×2
KIT ACCESSORY DVNC DISP (KITS) ×1 IMPLANT
MANIFOLD NEPTUNE II (INSTRUMENTS) ×3 IMPLANT
NDL SAFETY ECLIPSE 18X1.5 (NEEDLE) ×1 IMPLANT
NEEDLE HYPO 18GX1.5 SHARP (NEEDLE) ×2
PACK ROBOT UROLOGY CUSTOM (CUSTOM PROCEDURE TRAY) ×3 IMPLANT
RELOAD GREEN ECHELON 45 (STAPLE) ×3 IMPLANT
SEALER TISSUE G2 CVD JAW 45CM (ENDOMECHANICALS) ×3 IMPLANT
SET TUBE IRRIG SUCTION NO TIP (IRRIGATION / IRRIGATOR) ×3 IMPLANT
SOLUTION ELECTROLUBE (MISCELLANEOUS) ×3 IMPLANT
SUT ETHILON 3 0 PS 1 (SUTURE) ×3 IMPLANT
SUT MNCRL AB 4-0 PS2 18 (SUTURE) ×6 IMPLANT
SUT V-LOC BARB 180 2/0GR6 GS22 (SUTURE) ×6
SUT VICRYL 0 UR6 27IN ABS (SUTURE) ×6 IMPLANT
SUTURE V-LC BRB 180 2/0GR6GS22 (SUTURE) ×2 IMPLANT
SYRINGE 1CC 27X.5 TB SAFETYGLD (MISCELLANEOUS) IMPLANT
TOWEL OR NON WOVEN STRL DISP B (DISPOSABLE) ×6 IMPLANT
WATER STERILE IRR 1500ML POUR (IV SOLUTION) ×6 IMPLANT

## 2014-01-23 NOTE — Anesthesia Postprocedure Evaluation (Signed)
  Anesthesia Post-op Note  Patient: Roberto Dixon  Procedure(s) Performed: Procedure(s) (LRB): ROBOTIC ASSISTED LAPAROSCOPIC RADICAL PROSTATECTOMY WITH BILATERAL NREVE SPARE (Bilateral)  Patient Location: PACU  Anesthesia Type: General  Level of Consciousness: awake and alert   Airway and Oxygen Therapy: Patient Spontanous Breathing  Post-op Pain: mild  Post-op Assessment: Post-op Vital signs reviewed, Patient's Cardiovascular Status Stable, Respiratory Function Stable, Patent Airway and No signs of Nausea or vomiting  Last Vitals:  Filed Vitals:   01/23/14 1715  BP: 141/92  Pulse: 71  Temp: 36.4 C  Resp: 20    Post-op Vital Signs: stable   Complications: No apparent anesthesia complications

## 2014-01-23 NOTE — Progress Notes (Signed)
Rec'd pt from PACU. Condition stable. VSS at this time. Cont plan of care

## 2014-01-23 NOTE — Interval H&P Note (Signed)
History and Physical Interval Note:  01/23/2014 12:41 PM  Roberto Dixon  has presented today for surgery, with the diagnosis of PROSTATE CANCER  The various methods of treatment have been discussed with the patient and family. After consideration of risks, benefits and other options for treatment, the patient has consented to  Procedure(s): ROBOTIC ASSISTED LAPAROSCOPIC RADICAL PROSTATECTOMY WITH BILATERAL NREVE SPARE (Bilateral) as a surgical intervention .  The patient's history has been reviewed, patient examined, no change in status, stable for surgery.  I have reviewed the patient's chart and labs.  Questions were answered to the patient's satisfaction.     [name]

## 2014-01-23 NOTE — Transfer of Care (Signed)
Immediate Anesthesia Transfer of Care Note  Patient: Roberto Dixon  Procedure(s) Performed: Procedure(s): ROBOTIC ASSISTED LAPAROSCOPIC RADICAL PROSTATECTOMY WITH BILATERAL NREVE SPARE (Bilateral)  Patient Location: PACU  Anesthesia Type:General  Level of Consciousness: awake, sedated and responds to stimulation  Airway & Oxygen Therapy: Patient Spontanous Breathing and Patient connected to face mask oxygen  Post-op Assessment: Report given to PACU RN, Post -op Vital signs reviewed and stable and Patient moving all extremities X 4  Post vital signs: Reviewed and stable  Complications: No apparent anesthesia complications

## 2014-01-23 NOTE — Op Note (Signed)
Preoperative diagnosis: Clinically localized adenocarcinoma of the prostate (clinical stage T1c)  Postoperative diagnosis: Clinically localized adenocarcinoma of the prostate (clinical stage T1c)  Procedure:  1. Robotic assisted laparoscopic radical prostatectomy bilateral nerve sparing   Surgeon: Irine Seal M.D.  Assistant:Amanda Yarborough PA  Anesthesia: General  Complications: None  EBL: 100 mL   Specimens: 1. Prostate and seminal vesicles  Disposition of specimens: Pathology  Drains: 1. 20 Fr coude catheter 2. # 19 Blake pelvic drain  Indication: Roberto Dixon is a 53 y.o. year old patient with clinically localized prostate cancer.  After a thorough review of the management options for treatment of prostate cancer, he elected to proceed with surgical therapy and the above procedure(s).  We have discussed the potential benefits and risks of the procedure, side effects of the proposed treatment, the likelihood of the patient achieving the goals of the procedure, and any potential problems that might occur during the procedure or recuperation. Informed consent has been obtained.  Description of procedure:  The patient was taken to the operating room and a general anesthetic was administered. He was given preoperative antibiotics, placed in the dorsal lithotomy position.  PAS hose were placed and a red rubber rectal catheter was placed, secured with tape and attached to an asepto syringe.  He was then prepped with chloroprep on the abdomen and betadine on the genitalia and then draped in the usual sterile fashion . Next a preoperative timeout was performed.   A urethral catheter was placed into the bladder and a site was selected near the umbilicus for placement of the camera port. This was placed using a standard open Hassan technique which allowed entry into the peritoneal cavity under direct vision and without difficulty. A 12 mm port was placed and a pneumoperitoneum  established. The camera was then used to inspect the abdomen and there was no evidence of any intra-abdominal injuries or other abnormalities. The remaining abdominal ports were then placed. 8 mm robotic ports were placed in the right lower quadrant, left lower quadrant, and far left lateral abdominal wall. A 5 mm port was placed in the right upper quadrant and a 12 mm port was placed in the right lateral abdominal wall for laparoscopic assistance. All ports were placed under direct vision without difficulty. The surgical cart was then docked.   Utilizing the cautery scissors, the bladder was reflected posteriorly allowing entry into the space of Retzius and identification of the endopelvic fascia and prostate. The periprostatic fat was then removed from the prostate allowing full exposure of the endopelvic fascia. The endopelvic fascia was then incised from the apex back to the base of the prostate bilaterally and the underlying levator muscle fibers were swept laterally off the prostate thereby isolating the dorsal venous complex. The dorsal vein was then stapled and divided with a 45 mm Flex Echelon stapler. Attention then turned to the bladder neck which was divided anteriorly thereby allowing entry into the bladder and exposure of the urethral catheter. The catheter balloon was deflated and the catheter was brought into the operative field and used to retract the prostate anteriorly. The posterior bladder neck was then examined and was divided allowing further dissection between the bladder and prostate posteriorly until the vasa deferentia and seminal vessels were identified. The vasa deferentia were isolated, divided, and lifted anteriorly. The seminal vesicles were dissected down to their tips with care to control the seminal vascular arterial blood supply. These structures were then lifted anteriorly and the space between  Denonvillier's fascia and the anterior rectum was developed with a combination of  sharp and blunt dissection. This isolated the vascular pedicles of the prostate.  The lateral prostatic fascia was then sharply incised allowing release of the neurovascular bundles bilaterally. The vascular pedicles of the prostate were then ligated with the Enseal  between the prostate and neurovascular bundles and divided with sharp cold scissor dissection resulting in bilateral neurovascular bundle preservation. The neurovascular bundles were then separated off the apex of the prostate and urethra bilaterally.  The urethra was then sharply transected allowing the prostate specimen to be disarticulated. The pelvis was copiously irrigated and hemostasis was ensured. There was no evidence for rectal injury.  Attention then turned to the urethral anastomosis. A 2-0 Vicryl slip knot was placed between Denonvillier's fascia, the posterior bladder neck, and the posterior urethra to reapproximate these structures. A double-armed 3-0  V-lock uture was then used to perform a 360 running tension-free anastomosis between the bladder neck and urethra.   A new urethral catheter was then placed into the bladder and irrigated. There were no blood clots within the bladder and the anastomosis appeared to be watertight. A #19 Blake drain was then brought through the left lateral 8 mm port site and positioned appropriately within the pelvis. It was secured to the skin with a nylon suture. The surgical cart was then undocked. The right lateral 12 mm port site was closed at the fascial level with a 0 Vicryl suture placed laparoscopically. All remaining ports were then removed under direct vision.  The prostate specimen was removed intact within the Endopouch retrieval bag via the periumbilical camera port site. This fascial opening was closed with running 0 Vicryl sutures. 0.25% Marcaine was then injected into all port sites and all incisions were reapproximated at the skin level with clips and Dermabond. The patient  appeared to tolerate the procedure well and without complications. The patient was able to be extubated and transferred to the recovery unit in satisfactory condition.

## 2014-01-23 NOTE — Discharge Instructions (Signed)

## 2014-01-23 NOTE — Anesthesia Preprocedure Evaluation (Signed)
Anesthesia Evaluation  Patient identified by MRN, date of birth, ID band Patient awake    Reviewed: Allergy & Precautions, H&P , NPO status , Patient's Chart, lab work & pertinent test results  Airway Mallampati: II TM Distance: >3 FB Neck ROM: Full    Dental no notable dental hx.    Pulmonary pneumonia -, resolved, Current Smoker,  breath sounds clear to auscultation  Pulmonary exam normal       Cardiovascular Exercise Tolerance: Good hypertension, Pt. on medications Rhythm:Regular Rate:Normal     Neuro/Psych Anxiety negative neurological ROS     GI/Hepatic negative GI ROS, Neg liver ROS,   Endo/Other  negative endocrine ROS  Renal/GU negative Renal ROS  negative genitourinary   Musculoskeletal negative musculoskeletal ROS (+)   Abdominal   Peds negative pediatric ROS (+)  Hematology negative hematology ROS (+)   Anesthesia Other Findings   Reproductive/Obstetrics negative OB ROS                           Anesthesia Physical Anesthesia Plan  ASA: II  Anesthesia Plan: General   Post-op Pain Management:    Induction: Intravenous  Airway Management Planned: Oral ETT  Additional Equipment:   Intra-op Plan:   Post-operative Plan: Extubation in OR  Informed Consent: I have reviewed the patients History and Physical, chart, labs and discussed the procedure including the risks, benefits and alternatives for the proposed anesthesia with the patient or authorized representative who has indicated his/her understanding and acceptance.   Dental advisory given  Plan Discussed with: CRNA  Anesthesia Plan Comments:         Anesthesia Quick Evaluation

## 2014-01-24 ENCOUNTER — Encounter (HOSPITAL_COMMUNITY): Payer: Self-pay | Admitting: Urology

## 2014-01-24 LAB — BASIC METABOLIC PANEL
Anion gap: 11 (ref 5–15)
BUN: 9 mg/dL (ref 6–23)
CHLORIDE: 100 meq/L (ref 96–112)
CO2: 27 meq/L (ref 19–32)
CREATININE: 0.9 mg/dL (ref 0.50–1.35)
Calcium: 9.4 mg/dL (ref 8.4–10.5)
GFR calc Af Amer: >90 mL/min (ref >90)
GFR calc non Af Amer: >90 mL/min (ref >90)
GLUCOSE: 128 mg/dL — AB (ref 70–99)
Potassium: 4.7 mEq/L (ref 3.7–5.3)
Sodium: 138 mEq/L (ref 137–147)

## 2014-01-24 LAB — HEMOGLOBIN AND HEMATOCRIT, BLOOD
HCT: 39.8 % (ref 39.0–52.0)
Hemoglobin: 13.6 g/dL (ref 13.0–17.0)

## 2014-01-24 MED ORDER — BISACODYL 10 MG RE SUPP
10.0000 mg | Freq: Once | RECTAL | Status: AC
  Administered 2014-01-24: 10 mg via RECTAL
  Filled 2014-01-24: qty 1

## 2014-01-24 NOTE — Discharge Summary (Signed)
  Date of admission: 01/23/2014  Date of discharge: 01/24/2014  Admission diagnosis: Prostate Cancer  Discharge diagnosis: Prostate Cancer  History and Physical: For full details, please see admission history and physical. Briefly, Roberto Dixon is a 53 y.o. gentleman with localized prostate cancer.  After discussing management/treatment options, he elected to proceed with surgical treatment.  Hospital Course: MARQUIE ADERHOLD was taken to the operating room on 01/23/2014 and underwent a robotic assisted laparoscopic radical prostatectomy. He tolerated this procedure well and without complications. Postoperatively, he was able to be transferred to a regular hospital room following recovery from anesthesia.  He was able to begin ambulating the night of surgery. He remained hemodynamically stable overnight.  He had excellent urine output with appropriately minimal output from his pelvic drain and his pelvic drain was removed on POD #1.  He was transitioned to oral pain medication, tolerated a clear liquid diet, and had met all discharge criteria and was able to be discharged home later on POD#1.  Laboratory values:  Recent Labs  01/23/14 1610 01/24/14 0425  HGB 13.5 13.6  HCT 38.9* 39.8    Disposition: Home  Discharge instruction: He was instructed to be ambulatory but to refrain from heavy lifting, strenuous activity, or driving. He was instructed on urethral catheter care.  Discharge medications:     Medication List    STOP taking these medications       FISH OIL PO     naproxen 500 MG tablet  Commonly known as:  NAPROSYN     tamsulosin 0.4 MG Caps capsule  Commonly known as:  FLOMAX      TAKE these medications       ALPRAZolam 1 MG tablet  Commonly known as:  XANAX  Take 1 mg by mouth at bedtime as needed for anxiety.     ciprofloxacin 500 MG tablet  Commonly known as:  CIPRO  Take 1 tablet (500 mg total) by mouth 2 (two) times daily. Start day prior to office visit for  foley removal     cyclobenzaprine 10 MG tablet  Commonly known as:  FLEXERIL  Take 10 mg by mouth 3 (three) times daily as needed for muscle spasms.     olmesartan 20 MG tablet  Commonly known as:  BENICAR  Take 20 mg by mouth every morning.     oxyCODONE-acetaminophen 5-325 MG per tablet  Commonly known as:  PERCOCET/ROXICET  Take 1-2 tablets by mouth every 6 (six) hours as needed for severe pain.     oxymetazoline 0.05 % nasal spray  Commonly known as:  AFRIN  Place 1 spray into both nostrils 2 (two) times daily as needed for congestion.        Followup: He will followup in 1 week for catheter removal and to discuss his surgical pathology results.

## 2014-01-24 NOTE — Progress Notes (Signed)
Pt had some difficulty sleeping tonight, due to lower abdominal discomfort. Pt reported that standing up or walking relieved most of the discomfort. Pt informed as long as he walks with someone he can walk as much as he likes. Will continue to monitor.

## 2014-01-24 NOTE — Progress Notes (Signed)
Pt given home care prostatectomy discharge teaching and instructions. Pt able to voice and demonstrate foley to leg bag. Pt and Wife verbalized understanding of all discharge teaching and instructions.

## 2014-01-24 NOTE — Progress Notes (Addendum)
Patient ID: Roberto Dixon, male   DOB: Oct 22, 1960, 53 y.o.   MRN: [id number] Post-op note  Subjective: The patient is doing well.  No complaints.  Has amb.  No N/V.   Objective: Vital signs in last 24 hours: Temp:  [97.6 F (36.4 C)-98.3 F (36.8 C)] 98 F (36.7 C) (08/13 0556) Pulse Rate:  [62-81] 72 (08/13 0556) Resp:  [9-22] 20 (08/13 0556) BP: (103-161)/(56-95) 110/57 mmHg (08/13 0556) SpO2:  [96 %-100 %] 98 % (08/13 0556) Weight:  [110.224 kg (243 lb)] 110.224 kg (243 lb) (08/12 1036)  Intake/Output from previous day: 08/12 0701 - 08/13 0700 In: 3660.8 [P.O.:240; I.V.:3420.8] Out: 2248 [Urine:3700; Drains:80; Blood:50] Intake/Output this shift:    Physical Exam:  General: Alert and oriented. Cardiac: RRR Lungs: CTA bilat Abdomen: Soft, Nondistended. JP in place with serosang drainage Incisions: Clean and dry. Urine: clear  Lab Results:  Recent Labs  01/23/14 1610 01/24/14 0425  HGB 13.5 13.6  HCT 38.9* 39.8    Assessment/Plan: POD#0  1) Amb. IS  2) D/c JP  3) Dulcolax supp  4) D/C IV  5) Plan for poss d/c home later today   LOS: 1 day   Marcie Bal 01/24/2014, 7:31 AM

## 2014-01-25 NOTE — Discharge Summary (Signed)
I saw and examined the patient prior to discharge.

## 2014-10-18 ENCOUNTER — Ambulatory Visit (INDEPENDENT_AMBULATORY_CARE_PROVIDER_SITE_OTHER): Payer: BLUE CROSS/BLUE SHIELD | Admitting: Urology

## 2014-10-18 DIAGNOSIS — N5231 Erectile dysfunction following radical prostatectomy: Secondary | ICD-10-CM | POA: Diagnosis not present

## 2014-10-18 DIAGNOSIS — N393 Stress incontinence (female) (male): Secondary | ICD-10-CM

## 2014-10-18 DIAGNOSIS — C61 Malignant neoplasm of prostate: Secondary | ICD-10-CM | POA: Diagnosis not present

## 2015-01-24 ENCOUNTER — Ambulatory Visit: Payer: BLUE CROSS/BLUE SHIELD | Admitting: Urology

## 2015-03-14 ENCOUNTER — Ambulatory Visit (INDEPENDENT_AMBULATORY_CARE_PROVIDER_SITE_OTHER): Payer: BLUE CROSS/BLUE SHIELD | Admitting: Urology

## 2015-03-14 DIAGNOSIS — N5231 Erectile dysfunction following radical prostatectomy: Secondary | ICD-10-CM | POA: Diagnosis not present

## 2015-03-14 DIAGNOSIS — N393 Stress incontinence (female) (male): Secondary | ICD-10-CM | POA: Diagnosis not present

## 2015-03-14 DIAGNOSIS — C61 Malignant neoplasm of prostate: Secondary | ICD-10-CM | POA: Diagnosis not present

## 2015-04-18 ENCOUNTER — Ambulatory Visit (INDEPENDENT_AMBULATORY_CARE_PROVIDER_SITE_OTHER): Payer: BLUE CROSS/BLUE SHIELD | Admitting: Urology

## 2015-04-18 DIAGNOSIS — N5231 Erectile dysfunction following radical prostatectomy: Secondary | ICD-10-CM

## 2015-04-18 DIAGNOSIS — C61 Malignant neoplasm of prostate: Secondary | ICD-10-CM | POA: Diagnosis not present

## 2015-04-18 DIAGNOSIS — E291 Testicular hypofunction: Secondary | ICD-10-CM

## 2015-04-18 DIAGNOSIS — N393 Stress incontinence (female) (male): Secondary | ICD-10-CM | POA: Diagnosis not present

## 2015-07-18 ENCOUNTER — Ambulatory Visit (INDEPENDENT_AMBULATORY_CARE_PROVIDER_SITE_OTHER): Payer: BLUE CROSS/BLUE SHIELD | Admitting: Urology

## 2015-07-18 DIAGNOSIS — N5231 Erectile dysfunction following radical prostatectomy: Secondary | ICD-10-CM | POA: Diagnosis not present

## 2015-07-18 DIAGNOSIS — E291 Testicular hypofunction: Secondary | ICD-10-CM

## 2015-07-18 DIAGNOSIS — C61 Malignant neoplasm of prostate: Secondary | ICD-10-CM

## 2015-10-14 DIAGNOSIS — C61 Malignant neoplasm of prostate: Secondary | ICD-10-CM | POA: Diagnosis not present

## 2015-10-14 DIAGNOSIS — E291 Testicular hypofunction: Secondary | ICD-10-CM | POA: Diagnosis not present

## 2015-10-17 ENCOUNTER — Ambulatory Visit (INDEPENDENT_AMBULATORY_CARE_PROVIDER_SITE_OTHER): Payer: BLUE CROSS/BLUE SHIELD | Admitting: Urology

## 2015-10-17 DIAGNOSIS — N5231 Erectile dysfunction following radical prostatectomy: Secondary | ICD-10-CM | POA: Diagnosis not present

## 2015-10-17 DIAGNOSIS — N393 Stress incontinence (female) (male): Secondary | ICD-10-CM | POA: Diagnosis not present

## 2015-10-17 DIAGNOSIS — E291 Testicular hypofunction: Secondary | ICD-10-CM | POA: Diagnosis not present

## 2015-10-17 DIAGNOSIS — C61 Malignant neoplasm of prostate: Secondary | ICD-10-CM | POA: Diagnosis not present

## 2016-01-23 DIAGNOSIS — Z23 Encounter for immunization: Secondary | ICD-10-CM | POA: Diagnosis not present

## 2016-01-29 DIAGNOSIS — Z79899 Other long term (current) drug therapy: Secondary | ICD-10-CM | POA: Diagnosis not present

## 2016-01-29 DIAGNOSIS — I10 Essential (primary) hypertension: Secondary | ICD-10-CM | POA: Diagnosis not present

## 2016-01-29 DIAGNOSIS — F419 Anxiety disorder, unspecified: Secondary | ICD-10-CM | POA: Diagnosis not present

## 2016-01-29 DIAGNOSIS — Z1389 Encounter for screening for other disorder: Secondary | ICD-10-CM | POA: Diagnosis not present

## 2016-01-29 DIAGNOSIS — Z6831 Body mass index (BMI) 31.0-31.9, adult: Secondary | ICD-10-CM | POA: Diagnosis not present

## 2016-01-29 DIAGNOSIS — G4700 Insomnia, unspecified: Secondary | ICD-10-CM | POA: Diagnosis not present

## 2016-01-29 DIAGNOSIS — E782 Mixed hyperlipidemia: Secondary | ICD-10-CM | POA: Diagnosis not present

## 2016-03-30 DIAGNOSIS — H6692 Otitis media, unspecified, left ear: Secondary | ICD-10-CM | POA: Diagnosis not present

## 2016-03-30 DIAGNOSIS — J329 Chronic sinusitis, unspecified: Secondary | ICD-10-CM | POA: Diagnosis not present

## 2016-04-05 DIAGNOSIS — Z1389 Encounter for screening for other disorder: Secondary | ICD-10-CM | POA: Diagnosis not present

## 2016-04-05 DIAGNOSIS — Z6833 Body mass index (BMI) 33.0-33.9, adult: Secondary | ICD-10-CM | POA: Diagnosis not present

## 2016-04-05 DIAGNOSIS — I10 Essential (primary) hypertension: Secondary | ICD-10-CM | POA: Diagnosis not present

## 2016-04-05 DIAGNOSIS — E782 Mixed hyperlipidemia: Secondary | ICD-10-CM | POA: Diagnosis not present

## 2016-04-05 DIAGNOSIS — M1991 Primary osteoarthritis, unspecified site: Secondary | ICD-10-CM | POA: Diagnosis not present

## 2016-04-13 DIAGNOSIS — H918X1 Other specified hearing loss, right ear: Secondary | ICD-10-CM | POA: Diagnosis not present

## 2016-04-13 DIAGNOSIS — H9122 Sudden idiopathic hearing loss, left ear: Secondary | ICD-10-CM | POA: Diagnosis not present

## 2016-04-13 DIAGNOSIS — H90A22 Sensorineural hearing loss, unilateral, left ear, with restricted hearing on the contralateral side: Secondary | ICD-10-CM | POA: Diagnosis not present

## 2016-04-23 DIAGNOSIS — H9122 Sudden idiopathic hearing loss, left ear: Secondary | ICD-10-CM | POA: Diagnosis not present

## 2016-04-27 ENCOUNTER — Other Ambulatory Visit: Payer: Self-pay | Admitting: Otolaryngology

## 2016-04-27 DIAGNOSIS — H905 Unspecified sensorineural hearing loss: Secondary | ICD-10-CM

## 2016-05-05 DIAGNOSIS — B9562 Methicillin resistant Staphylococcus aureus infection as the cause of diseases classified elsewhere: Secondary | ICD-10-CM | POA: Diagnosis not present

## 2016-05-05 DIAGNOSIS — Z6833 Body mass index (BMI) 33.0-33.9, adult: Secondary | ICD-10-CM | POA: Diagnosis not present

## 2016-05-07 ENCOUNTER — Encounter (HOSPITAL_COMMUNITY): Payer: Self-pay | Admitting: *Deleted

## 2016-05-07 ENCOUNTER — Emergency Department (HOSPITAL_COMMUNITY)
Admission: EM | Admit: 2016-05-07 | Discharge: 2016-05-07 | Disposition: A | Discharge status: Home / Self Care | Payer: BLUE CROSS/BLUE SHIELD | Attending: Emergency Medicine | Admitting: Emergency Medicine

## 2016-05-07 DIAGNOSIS — I10 Essential (primary) hypertension: Secondary | ICD-10-CM | POA: Insufficient documentation

## 2016-05-07 DIAGNOSIS — Z8546 Personal history of malignant neoplasm of prostate: Secondary | ICD-10-CM | POA: Diagnosis not present

## 2016-05-07 DIAGNOSIS — F17210 Nicotine dependence, cigarettes, uncomplicated: Secondary | ICD-10-CM | POA: Insufficient documentation

## 2016-05-07 DIAGNOSIS — Z79899 Other long term (current) drug therapy: Secondary | ICD-10-CM | POA: Insufficient documentation

## 2016-05-07 DIAGNOSIS — L02212 Cutaneous abscess of back [any part, except buttock]: Secondary | ICD-10-CM | POA: Diagnosis not present

## 2016-05-07 HISTORY — DX: Unspecified hearing loss, unspecified ear: H91.90

## 2016-05-07 MED ORDER — SULFAMETHOXAZOLE-TRIMETHOPRIM 800-160 MG PO TABS
1.0000 | ORAL_TABLET | Freq: Once | ORAL | Status: AC
  Administered 2016-05-07: 1 via ORAL
  Filled 2016-05-07: qty 1

## 2016-05-07 MED ORDER — MORPHINE SULFATE (PF) 2 MG/ML IV SOLN
4.0000 mg | Freq: Once | INTRAVENOUS | Status: AC
  Administered 2016-05-07: 4 mg via INTRAMUSCULAR
  Filled 2016-05-07: qty 2

## 2016-05-07 MED ORDER — SULFAMETHOXAZOLE-TRIMETHOPRIM 800-160 MG PO TABS: 1.0000 | ORAL_TABLET | Freq: Two times a day (BID) | ORAL | 0 refills | Status: DC

## 2016-05-07 MED ORDER — PERCOCET 5-325 MG PO TABS: 1.0000 | ORAL_TABLET | Freq: Four times a day (QID) | ORAL | 0 refills | Status: DC | PRN

## 2016-05-07 MED ORDER — LIDOCAINE-EPINEPHRINE (PF) 2 %-1:200000 IJ SOLN
10.0000 mL | Freq: Once | INTRAMUSCULAR | Status: AC
  Administered 2016-05-07: 10 mL via INTRADERMAL
  Filled 2016-05-07: qty 20

## 2016-05-07 NOTE — Discharge Instructions (Signed)
Soak in a tub of warm water. Take the antibiotics until gone. You can remove the packing in 2 days while in the shower. Take the percocet for severe pain, otherwise take ibuprofen 600 mg + acetaminophen 1000 mg 4 times a day for pain.  Recheck if it gets worse such as fever, worsening pain, the area of redness or swelling gets bigger.

## 2016-05-07 NOTE — ED Triage Notes (Signed)
Pt c/o red raised area to middle of back; pt states it started out as a small red bump x 2 weeks ago and has gotten progressively worse; pt states it hurts to shirt to touch it or to lay down

## 2016-05-07 NOTE — ED Provider Notes (Signed)
[facility] DEPT Provider Note   CSN: [id number] Arrival date & time: 05/07/16  0050  Time seen 0127 AM   History   Chief Complaint Chief Complaint  Patient presents with  . Abscess    HPI Roberto Dixon is a 55 y.o. male.  HPI Pt reports several weeks ago he had a small red area on his right back that has gotten progressively worse. He states it drained spontaneously 6 days ago but since then it stopped draining and has gotten bigger and more painful. He denies fever. He had it before in about the same area.  He denies having diabetes.    PCP Dr Hilma Favors  Past Medical History:  Diagnosis Date  . Anxiety   . Arthritis    HANDS AND EVERYWHERE  . Cancer [facility])    PROSTATE CANCER  . Hearing loss    hearling loss to left ear, suddenly  . Hypertension   . Pain    PT STATES RUPTURED DISC - LUMBAR - SOME LOWER BACK, RIGHT HIP, RIGHT LEG PAIN - NUMBNESS WITH PROLONGED STATNDING  . Pneumonia    YEARS AGO  . Ruptured lumbar disc    PT STATES HE HAS RUPTURED DISC - LUMBAR THAT SOMETIMES CAUSES RT HIP AND RT LEG PAIN AND NUMBNESS WITH PROLONGED STANDING.    Patient Active Problem List   Diagnosis Date Noted  . Malignant neoplasm of prostate ([place]) 01/23/2014  . Radicular low back pain 08/07/2013  . PULMONARY NODULE, RIGHT LOWER LOBE 07/06/2010  . HYPERLIPIDEMIA 07/03/2010  . HYPERTENSION 07/03/2010  . ECZEMA 07/03/2010    Past Surgical History:  Procedure Laterality Date  . JOINT REPLACEMENT     LEFT TOTAL KNEE REPLACEMENT  2010  . LUNG SURGERY FOR COLLAPSED LUNG  1994 ?  Marland Kitchen RIGHT SHOULDER SURGERY TO SHAVE SOME BONE  2003 ?  Marland Kitchen RIGHT THUMB SURGERY  FOR CHRONIC DISLOCATION  1997 ?  . ROBOT ASSISTED LAPAROSCOPIC RADICAL PROSTATECTOMY Bilateral 01/23/2014   Procedure: ROBOTIC ASSISTED LAPAROSCOPIC RADICAL PROSTATECTOMY WITH BILATERAL NREVE SPARE;  Surgeon: Malka So, MD;  Location: [facility];  Service: Urology;  Laterality: Bilateral;       Home Medications    Prior  to Admission medications   Medication Sig Start Date End Date Taking? Authorizing Provider  ALPRAZolam Duanne Moron) 1 MG tablet Take 1 mg by mouth at bedtime as needed for anxiety.   Yes Historical Provider, MD  olmesartan (BENICAR) 20 MG tablet Take 20 mg by mouth every morning.   Yes Historical Provider, MD  oxymetazoline (AFRIN) 0.05 % nasal spray Place 1 spray into both nostrils 2 (two) times daily as needed for congestion.   Yes Historical Provider, MD  TURMERIC PO Take by mouth.   Yes Historical Provider, MD  PERCOCET 5-325 MG tablet Take 1 tablet by mouth every 6 (six) hours as needed for severe pain. 05/07/16   Rolland Porter, MD  sulfamethoxazole-trimethoprim (BACTRIM DS,SEPTRA DS) 800-160 MG tablet Take 1 tablet by mouth 2 (two) times daily. 05/07/16   Rolland Porter, MD    Family History History reviewed. No pertinent family history.  Social History Social History  Substance Use Topics  . Smoking status: Current Every Day Smoker    Packs/day: 0.25    Years: 35.00    Types: Cigarettes  . Smokeless tobacco: Never Used  . 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    Physical Exam Updated Vital Signs BP 124/65 (BP Location: Right Arm)   Pulse 113   Temp 97.9 F (36.6 C) (Oral)   Resp 18   Ht 6\' 2"  (1.88 m)   Wt 250 lb (113.4 kg)   SpO2 97%   BMI 32.10 kg/m   Vital signs normal except for tachycardia   Physical Exam  Constitutional: He appears well-developed and well-nourished.  HENT:  Head: Normocephalic and atraumatic.  Nose: Nose normal.  Eyes: Conjunctivae and EOM are normal.  Neck: Normal range of motion.  Cardiovascular: Normal rate.   Pulmonary/Chest: Effort normal. No respiratory distress.  Musculoskeletal: Normal range of motion.  Skin:  Patient has a 4 x 6 cm area of redness that is raised and tender on his right mid back consistent with an abscess.        ED Treatments / Results   Procedures .Marland KitchenIncision and Drainage Date/Time: 05/07/2016 1:38 AM Performed by: Tomi Bamberger, Kimberley Dastrup Authorized by: Rolland Porter   Consent:    Consent obtained:  Verbal   Consent given by:  Patient   Risks discussed:  Pain and bleeding   Alternatives discussed:  No treatment Location:    Type:  Abscess   Size:  4 x 6 cm   Location: back. Pre-procedure details:    Skin preparation:  Chloraprep Anesthesia (see MAR for exact dosages):    Anesthesia method:  Local infiltration   Local anesthetic:  Lidocaine 2% WITH epi Procedure type:    Complexity:  Complex Procedure details:    Incision types:  Single straight   Incision depth:  Subcutaneous   Scalpel blade:  11   Wound management:  Probed and deloculated   Drainage:  Purulent and bloody   Drainage amount:  Moderate   Wound treatment:  Wound left open   Packing materials:  1/4 in gauze Post-procedure details:    Patient tolerance of procedure:  Tolerated well, no immediate complications    (including critical care time)  Medications Ordered in ED Medications  sulfamethoxazole-trimethoprim (BACTRIM DS,SEPTRA DS) 800-160 MG per tablet 1 tablet (not administered)  lidocaine-EPINEPHrine (XYLOCAINE W/EPI) 2 %-1:200000 (PF) injection 10 mL (10 mLs Intradermal Given by Other 05/07/16 0145)  morphine 2 MG/ML injection 4 mg (4 mg Intramuscular Given 05/07/16 0145)     Initial Impression / Assessment and Plan / ED Course  I have reviewed the triage vital signs and the nursing notes.  Pertinent labs & imaging results that were available during my care of the patient were reviewed by me and considered in my medical decision making (see chart for details).  Clinical Course    Patient was given morphine for pain.  Final Clinical Impressions(s) / ED Diagnoses   Final diagnoses:  Abscess of back    New Prescriptions New Prescriptions   PERCOCET 5-325 MG TABLET    Take 1 tablet by mouth every 6  (six) hours as needed for severe pain.   SULFAMETHOXAZOLE-TRIMETHOPRIM (BACTRIM DS,SEPTRA DS) 800-160 MG TABLET    Take 1 tablet by mouth 2 (two) times daily.    Plan discharge  Rolland Porter, MD, Barbette Or, MD 05/07/16 [phone]

## 2016-05-16 ENCOUNTER — Ambulatory Visit
Admission: RE | Admit: 2016-05-16 | Discharge: 2016-05-16 | Disposition: A | Discharge status: Home / Self Care | Payer: BLUE CROSS/BLUE SHIELD | Source: Ambulatory Visit | Attending: Otolaryngology | Admitting: Otolaryngology

## 2016-05-16 DIAGNOSIS — H9042 Sensorineural hearing loss, unilateral, left ear, with unrestricted hearing on the contralateral side: Secondary | ICD-10-CM | POA: Diagnosis not present

## 2016-05-16 DIAGNOSIS — H905 Unspecified sensorineural hearing loss: Secondary | ICD-10-CM

## 2016-05-16 MED ORDER — GADOBENATE DIMEGLUMINE 529 MG/ML IV SOLN
10.0000 mL | Freq: Once | INTRAVENOUS | Status: AC | PRN
  Administered 2016-05-16: 10 mL via INTRAVENOUS

## 2016-05-27 DIAGNOSIS — B0052 Herpesviral keratitis: Secondary | ICD-10-CM | POA: Diagnosis not present

## 2016-05-28 DIAGNOSIS — B0052 Herpesviral keratitis: Secondary | ICD-10-CM | POA: Diagnosis not present

## 2016-05-28 DIAGNOSIS — E291 Testicular hypofunction: Secondary | ICD-10-CM | POA: Diagnosis not present

## 2016-06-04 ENCOUNTER — Ambulatory Visit (INDEPENDENT_AMBULATORY_CARE_PROVIDER_SITE_OTHER): Payer: BLUE CROSS/BLUE SHIELD | Admitting: Urology

## 2016-06-04 DIAGNOSIS — E291 Testicular hypofunction: Secondary | ICD-10-CM

## 2016-06-04 DIAGNOSIS — N5231 Erectile dysfunction following radical prostatectomy: Secondary | ICD-10-CM

## 2016-06-04 DIAGNOSIS — N393 Stress incontinence (female) (male): Secondary | ICD-10-CM

## 2016-06-04 DIAGNOSIS — Z8546 Personal history of malignant neoplasm of prostate: Secondary | ICD-10-CM | POA: Diagnosis not present

## 2016-09-02 DIAGNOSIS — E782 Mixed hyperlipidemia: Secondary | ICD-10-CM | POA: Diagnosis not present

## 2016-09-02 DIAGNOSIS — I10 Essential (primary) hypertension: Secondary | ICD-10-CM | POA: Diagnosis not present

## 2016-09-02 DIAGNOSIS — F419 Anxiety disorder, unspecified: Secondary | ICD-10-CM | POA: Diagnosis not present

## 2016-09-02 DIAGNOSIS — R7309 Other abnormal glucose: Secondary | ICD-10-CM | POA: Diagnosis not present

## 2016-09-02 DIAGNOSIS — Z1389 Encounter for screening for other disorder: Secondary | ICD-10-CM | POA: Diagnosis not present

## 2016-09-02 DIAGNOSIS — Z6833 Body mass index (BMI) 33.0-33.9, adult: Secondary | ICD-10-CM | POA: Diagnosis not present

## 2016-09-17 DIAGNOSIS — I10 Essential (primary) hypertension: Secondary | ICD-10-CM | POA: Diagnosis not present

## 2016-09-17 DIAGNOSIS — E782 Mixed hyperlipidemia: Secondary | ICD-10-CM | POA: Diagnosis not present

## 2016-09-17 DIAGNOSIS — F419 Anxiety disorder, unspecified: Secondary | ICD-10-CM | POA: Diagnosis not present

## 2016-09-17 DIAGNOSIS — Z1389 Encounter for screening for other disorder: Secondary | ICD-10-CM | POA: Diagnosis not present

## 2016-10-22 DIAGNOSIS — R944 Abnormal results of kidney function studies: Secondary | ICD-10-CM | POA: Diagnosis not present

## 2016-11-12 DIAGNOSIS — H16202 Unspecified keratoconjunctivitis, left eye: Secondary | ICD-10-CM | POA: Diagnosis not present

## 2016-11-30 DIAGNOSIS — Z8546 Personal history of malignant neoplasm of prostate: Secondary | ICD-10-CM | POA: Diagnosis not present

## 2016-11-30 DIAGNOSIS — E291 Testicular hypofunction: Secondary | ICD-10-CM | POA: Diagnosis not present

## 2016-12-03 DIAGNOSIS — Z6831 Body mass index (BMI) 31.0-31.9, adult: Secondary | ICD-10-CM | POA: Diagnosis not present

## 2016-12-03 DIAGNOSIS — E6609 Other obesity due to excess calories: Secondary | ICD-10-CM | POA: Diagnosis not present

## 2016-12-03 DIAGNOSIS — Z1389 Encounter for screening for other disorder: Secondary | ICD-10-CM | POA: Diagnosis not present

## 2016-12-03 DIAGNOSIS — F419 Anxiety disorder, unspecified: Secondary | ICD-10-CM | POA: Diagnosis not present

## 2016-12-03 DIAGNOSIS — E782 Mixed hyperlipidemia: Secondary | ICD-10-CM | POA: Diagnosis not present

## 2016-12-27 DIAGNOSIS — M25561 Pain in right knee: Secondary | ICD-10-CM | POA: Diagnosis not present

## 2016-12-27 DIAGNOSIS — M1711 Unilateral primary osteoarthritis, right knee: Secondary | ICD-10-CM | POA: Diagnosis not present

## 2017-01-19 DIAGNOSIS — M25561 Pain in right knee: Secondary | ICD-10-CM | POA: Diagnosis not present

## 2017-01-19 DIAGNOSIS — M1711 Unilateral primary osteoarthritis, right knee: Secondary | ICD-10-CM | POA: Diagnosis not present

## 2017-01-21 ENCOUNTER — Ambulatory Visit (INDEPENDENT_AMBULATORY_CARE_PROVIDER_SITE_OTHER): Payer: BLUE CROSS/BLUE SHIELD | Admitting: Urology

## 2017-01-21 DIAGNOSIS — N393 Stress incontinence (female) (male): Secondary | ICD-10-CM

## 2017-01-21 DIAGNOSIS — N5231 Erectile dysfunction following radical prostatectomy: Secondary | ICD-10-CM | POA: Diagnosis not present

## 2017-01-21 DIAGNOSIS — Z8546 Personal history of malignant neoplasm of prostate: Secondary | ICD-10-CM

## 2017-01-21 DIAGNOSIS — E291 Testicular hypofunction: Secondary | ICD-10-CM

## 2017-02-02 DIAGNOSIS — M7541 Impingement syndrome of right shoulder: Secondary | ICD-10-CM | POA: Diagnosis not present

## 2017-02-02 DIAGNOSIS — M19011 Primary osteoarthritis, right shoulder: Secondary | ICD-10-CM | POA: Diagnosis not present

## 2017-02-02 DIAGNOSIS — M25511 Pain in right shoulder: Secondary | ICD-10-CM | POA: Diagnosis not present

## 2017-03-08 DIAGNOSIS — Z23 Encounter for immunization: Secondary | ICD-10-CM | POA: Diagnosis not present

## 2017-03-08 DIAGNOSIS — R7309 Other abnormal glucose: Secondary | ICD-10-CM | POA: Diagnosis not present

## 2017-03-08 DIAGNOSIS — M1991 Primary osteoarthritis, unspecified site: Secondary | ICD-10-CM | POA: Diagnosis not present

## 2017-03-08 DIAGNOSIS — Z6832 Body mass index (BMI) 32.0-32.9, adult: Secondary | ICD-10-CM | POA: Diagnosis not present

## 2017-03-08 DIAGNOSIS — G894 Chronic pain syndrome: Secondary | ICD-10-CM | POA: Diagnosis not present

## 2017-03-08 DIAGNOSIS — I10 Essential (primary) hypertension: Secondary | ICD-10-CM | POA: Diagnosis not present

## 2017-03-08 DIAGNOSIS — E782 Mixed hyperlipidemia: Secondary | ICD-10-CM | POA: Diagnosis not present

## 2017-03-10 DIAGNOSIS — D3709 Neoplasm of uncertain behavior of other specified sites of the oral cavity: Secondary | ICD-10-CM | POA: Diagnosis not present

## 2017-06-20 DIAGNOSIS — Z6833 Body mass index (BMI) 33.0-33.9, adult: Secondary | ICD-10-CM | POA: Diagnosis not present

## 2017-06-20 DIAGNOSIS — R7309 Other abnormal glucose: Secondary | ICD-10-CM | POA: Diagnosis not present

## 2017-06-20 DIAGNOSIS — G894 Chronic pain syndrome: Secondary | ICD-10-CM | POA: Diagnosis not present

## 2017-06-20 DIAGNOSIS — F419 Anxiety disorder, unspecified: Secondary | ICD-10-CM | POA: Diagnosis not present

## 2017-06-20 DIAGNOSIS — I10 Essential (primary) hypertension: Secondary | ICD-10-CM | POA: Diagnosis not present

## 2017-06-20 DIAGNOSIS — Z1389 Encounter for screening for other disorder: Secondary | ICD-10-CM | POA: Diagnosis not present

## 2017-06-20 DIAGNOSIS — C61 Malignant neoplasm of prostate: Secondary | ICD-10-CM | POA: Diagnosis not present

## 2017-06-20 DIAGNOSIS — M1991 Primary osteoarthritis, unspecified site: Secondary | ICD-10-CM | POA: Diagnosis not present

## 2017-06-20 DIAGNOSIS — E782 Mixed hyperlipidemia: Secondary | ICD-10-CM | POA: Diagnosis not present

## 2017-06-20 DIAGNOSIS — M1990 Unspecified osteoarthritis, unspecified site: Secondary | ICD-10-CM | POA: Diagnosis not present

## 2017-06-20 DIAGNOSIS — J069 Acute upper respiratory infection, unspecified: Secondary | ICD-10-CM | POA: Diagnosis not present

## 2017-06-21 DIAGNOSIS — J069 Acute upper respiratory infection, unspecified: Secondary | ICD-10-CM | POA: Diagnosis not present

## 2017-06-21 DIAGNOSIS — E782 Mixed hyperlipidemia: Secondary | ICD-10-CM | POA: Diagnosis not present

## 2017-06-21 DIAGNOSIS — Z1389 Encounter for screening for other disorder: Secondary | ICD-10-CM | POA: Diagnosis not present

## 2017-06-21 DIAGNOSIS — G894 Chronic pain syndrome: Secondary | ICD-10-CM | POA: Diagnosis not present

## 2017-07-13 DIAGNOSIS — Z0000 Encounter for general adult medical examination without abnormal findings: Secondary | ICD-10-CM | POA: Diagnosis not present

## 2017-07-13 DIAGNOSIS — Z6833 Body mass index (BMI) 33.0-33.9, adult: Secondary | ICD-10-CM | POA: Diagnosis not present

## 2017-07-13 DIAGNOSIS — Z1389 Encounter for screening for other disorder: Secondary | ICD-10-CM | POA: Diagnosis not present

## 2017-07-13 DIAGNOSIS — I10 Essential (primary) hypertension: Secondary | ICD-10-CM | POA: Diagnosis not present

## 2017-07-13 DIAGNOSIS — G4700 Insomnia, unspecified: Secondary | ICD-10-CM | POA: Diagnosis not present

## 2017-07-22 DIAGNOSIS — E291 Testicular hypofunction: Secondary | ICD-10-CM | POA: Diagnosis not present

## 2017-07-22 DIAGNOSIS — Z8546 Personal history of malignant neoplasm of prostate: Secondary | ICD-10-CM | POA: Diagnosis not present

## 2017-07-29 ENCOUNTER — Ambulatory Visit: Payer: BLUE CROSS/BLUE SHIELD | Admitting: Urology

## 2017-07-29 DIAGNOSIS — N5231 Erectile dysfunction following radical prostatectomy: Secondary | ICD-10-CM | POA: Diagnosis not present

## 2017-07-29 DIAGNOSIS — Z8546 Personal history of malignant neoplasm of prostate: Secondary | ICD-10-CM | POA: Diagnosis not present

## 2017-07-29 DIAGNOSIS — N393 Stress incontinence (female) (male): Secondary | ICD-10-CM

## 2017-07-29 DIAGNOSIS — N281 Cyst of kidney, acquired: Secondary | ICD-10-CM | POA: Diagnosis not present

## 2017-07-29 DIAGNOSIS — D751 Secondary polycythemia: Secondary | ICD-10-CM

## 2017-08-04 ENCOUNTER — Inpatient Hospital Stay (HOSPITAL_COMMUNITY): Payer: BLUE CROSS/BLUE SHIELD | Attending: Internal Medicine | Admitting: Internal Medicine

## 2017-08-04 ENCOUNTER — Ambulatory Visit (HOSPITAL_COMMUNITY)
Admission: RE | Admit: 2017-08-04 | Discharge: 2017-08-04 | Disposition: A | Discharge status: Home / Self Care | Payer: BLUE CROSS/BLUE SHIELD | Source: Ambulatory Visit | Attending: Internal Medicine | Admitting: Internal Medicine

## 2017-08-04 ENCOUNTER — Encounter (HOSPITAL_COMMUNITY): Payer: Self-pay | Admitting: Internal Medicine

## 2017-08-04 ENCOUNTER — Inpatient Hospital Stay (HOSPITAL_COMMUNITY): Payer: BLUE CROSS/BLUE SHIELD

## 2017-08-04 ENCOUNTER — Encounter (HOSPITAL_COMMUNITY): Payer: Self-pay

## 2017-08-04 VITALS — BP 162/97 | HR 67 | Temp 98.4°F | Resp 16 | Ht 74.0 in | Wt 252.0 lb

## 2017-08-04 DIAGNOSIS — R911 Solitary pulmonary nodule: Secondary | ICD-10-CM

## 2017-08-04 DIAGNOSIS — F17210 Nicotine dependence, cigarettes, uncomplicated: Secondary | ICD-10-CM | POA: Diagnosis not present

## 2017-08-04 DIAGNOSIS — I10 Essential (primary) hypertension: Secondary | ICD-10-CM

## 2017-08-04 DIAGNOSIS — D751 Secondary polycythemia: Secondary | ICD-10-CM | POA: Diagnosis not present

## 2017-08-04 DIAGNOSIS — R0602 Shortness of breath: Secondary | ICD-10-CM | POA: Diagnosis not present

## 2017-08-04 DIAGNOSIS — C61 Malignant neoplasm of prostate: Secondary | ICD-10-CM

## 2017-08-04 DIAGNOSIS — Z87891 Personal history of nicotine dependence: Secondary | ICD-10-CM | POA: Insufficient documentation

## 2017-08-04 LAB — IRON AND TIBC
Iron: 165 ug/dL (ref 45–182)
Saturation Ratios: 42 % — ABNORMAL HIGH (ref 17.9–39.5)
TIBC: 393 ug/dL (ref 250–450)
UIBC: 228 ug/dL

## 2017-08-04 LAB — COMPREHENSIVE METABOLIC PANEL
ALT: 59 U/L (ref 17–63)
AST: 32 U/L (ref 15–41)
Albumin: 4.3 g/dL (ref 3.5–5.0)
Alkaline Phosphatase: 74 U/L (ref 38–126)
Anion gap: 11 (ref 5–15)
BUN: 20 mg/dL (ref 6–20)
CO2: 23 mmol/L (ref 22–32)
Calcium: 9.6 mg/dL (ref 8.9–10.3)
Chloride: 102 mmol/L (ref 101–111)
Creatinine, Ser: 1.09 mg/dL (ref 0.61–1.24)
GFR calc Af Amer: >60 mL/min (ref >60)
GFR calc non Af Amer: >60 mL/min (ref >60)
Glucose, Bld: 96 mg/dL (ref 65–99)
Potassium: 4.4 mmol/L (ref 3.5–5.1)
Sodium: 136 mmol/L (ref 135–145)
Total Bilirubin: 1 mg/dL (ref 0.3–1.2)
Total Protein: 7.5 g/dL (ref 6.5–8.1)

## 2017-08-04 LAB — CBC WITH DIFFERENTIAL/PLATELET
Basophils Absolute: 0.1 10*3/uL (ref 0.0–0.1)
Basophils Relative: 1 %
Eosinophils Absolute: 0.2 10*3/uL (ref 0.0–0.7)
Eosinophils Relative: 2 %
HCT: 50.9 % (ref 39.0–52.0)
Hemoglobin: 17.5 g/dL — ABNORMAL HIGH (ref 13.0–17.0)
Lymphocytes Relative: 38 %
Lymphs Abs: 3.1 10*3/uL (ref 0.7–4.0)
MCH: 29.3 pg (ref 26.0–34.0)
MCHC: 34.4 g/dL (ref 30.0–36.0)
MCV: 85.3 fL (ref 78.0–100.0)
Monocytes Absolute: 0.6 10*3/uL (ref 0.1–1.0)
Monocytes Relative: 8 %
Neutro Abs: 4.2 10*3/uL (ref 1.7–7.7)
Neutrophils Relative %: 51 %
Platelets: 181 10*3/uL (ref 150–400)
RBC: 5.97 MIL/uL — ABNORMAL HIGH (ref 4.22–5.81)
RDW: 13.8 % (ref 11.5–15.5)
WBC: 8.1 10*3/uL (ref 4.0–10.5)

## 2017-08-04 LAB — LACTATE DEHYDROGENASE: LDH: 158 U/L (ref 98–192)

## 2017-08-04 LAB — FERRITIN: FERRITIN: 259 ng/mL (ref 24–336)

## 2017-08-04 NOTE — Patient Instructions (Signed)
[place] at [facility] Discharge Instructions  RECOMMENDATIONS MADE BY THE CONSULTANT AND ANY TEST RESULTS WILL BE SENT TO YOUR REFERRING PHYSICIAN.  You were seen today by Dr. Zoila Shutter You will have lab work drawn today If you can stop by radiology on your way out and have a chest x ray taken We will see you back in 2 weeks to review results  Thank you for choosing [facility] at [facility] to provide your oncology and hematology care.  To afford each patient quality time with our provider, please arrive at least 15 minutes before your scheduled appointment time.    If you have a lab appointment with the [facility] please come in thru the  Main Entrance and check in at the main information desk  You need to re-schedule your appointment should you arrive 10 or more minutes late.  We strive to give you quality time with our providers, and arriving late affects you and other patients whose appointments are after yours.  Also, if you no show three or more times for appointments you may be dismissed from the clinic at the providers discretion.     Again, thank you for choosing [facility].  Our hope is that these requests will decrease the amount of time that you wait before being seen by our physicians.       _____________________________________________________________  Should you have questions after your visit to [facility], please contact our office at [phone] between the hours of 8:30 a.m. and 4:30 p.m.  Voicemails left after 4:30 p.m. will not be returned until the following business day.  For prescription refill requests, have your pharmacy contact our office.       Resources For Cancer Patients and their Caregivers ? American Cancer Society: Can assist with transportation, wigs, general needs, runs Look Good Feel Better.        807-464-2261 ? Cancer Care: Provides financial assistance, online  support groups, medication/co-pay assistance.  1-800-813-HOPE 817-568-4094) ? [facility] Assists [place] cancer patients and their families through emotional , educational and financial support.  (228) 122-1429 ? [place] DSS Where to apply for food stamps, Medicaid and utility assistance. (919)548-6679 ? [facility]: Transportation to medical appointments. 531-884-8642 ? Social Security Administration: May apply for disability if have a Stage IV cancer. 586-161-0117 626-432-4824 ? [employer], Disability and Transit Services: Assists with nutrition, care and transit needs. [facility] Support Programs: @10RELATIVEDAYS @ > Cancer Support Group  2nd Tuesday of the month 1pm-2pm, Journey Room  > Creative Journey  3rd Tuesday of the month 1130am-1pm, Journey Room  > Look Good Feel Better  1st Wednesday of the month 10am-12 noon, Journey Room (Call [facility] to register [phone])

## 2017-08-05 NOTE — Progress Notes (Signed)
Referring Physician:  Dr. Jeffie Pollock  Diagnosis Polycythemia, secondary - Plan: CBC with Differential/Platelet, Comprehensive metabolic panel, Lactate dehydrogenase, Ferritin, JAK2 genotypr, Iron and TIBC, Transferrin Saturation, Hemochromatosis DNA-PCR(c282y,h63d), DG Chest 2 View  Staging Cancer Staging No matching staging information was found prostate cancer.    Assessment and Plan: 1.  Polycythemia.  Labs performed today show a normal hematocrit of 50.  His iron studies show a normal ferritin of 259.  Percent saturation is slightly elevated at 42.  Hemochromatosis gene evaluation and Jak 2 studies are pending.  He will return to clinic in 2 weeks for follow-up to go over labs.  I discussed with him the likely etiology of his polycythemia may be multifactorial but is also likely due to his history of smoking.  Chest x-ray is recommended.  2.  Smoking.  Smoking cessation is recommended.  He has undergone evaluation for pulmonary nodules in the past.  Will refer him for lung cancer screening program evaluation.  3.  Pulmonary nodule.  Patient has undergone evaluation for this in the past.  He will be referred to the lung cancer screening program for evaluation.  4.  Prostate cancer.  He is followed by urology.  5.  Hypertension.  Blood pressure is 162/97.  Continue to follow with primary care physician.  HPI 57 year old male who is followed by urology.  He is receiving testosterone injections at their office and was noted to have a hematocrit of 46.5.  He reports a history of smoking.  He denies any family history of liver issues.  Has noted no skin changes.  He reports some left leg neuropathy and occasional back pain.  Patient is seen today for consultation due to suspected secondary polycythemia.  Problem List Patient Active Problem List   Diagnosis Date Noted  . Malignant neoplasm of prostate ([place]) [C61] 01/23/2014  . Radicular low back pain [M54.10] 08/07/2013  . PULMONARY NODULE, RIGHT  LOWER LOBE [J98.4] 07/06/2010  . HYPERLIPIDEMIA [E78.5] 07/03/2010  . HYPERTENSION [I10] 07/03/2010  . ECZEMA [L25.9] 07/03/2010    Past Medical History Past Medical History:  Diagnosis Date  . Anxiety   . Arthritis    HANDS AND EVERYWHERE  . Cancer [facility])    PROSTATE CANCER  . Hearing loss    hearling loss to left ear, suddenly  . Hypertension   . Pain    PT STATES RUPTURED DISC - LUMBAR - SOME LOWER BACK, RIGHT HIP, RIGHT LEG PAIN - NUMBNESS WITH PROLONGED STATNDING  . Pneumonia    YEARS AGO  . Ruptured lumbar disc    PT STATES HE HAS RUPTURED DISC - LUMBAR THAT SOMETIMES CAUSES RT HIP AND RT LEG PAIN AND NUMBNESS WITH PROLONGED STANDING.    Past Surgical History Past Surgical History:  Procedure Laterality Date  . JOINT REPLACEMENT     LEFT TOTAL KNEE REPLACEMENT  2010  . LUNG SURGERY FOR COLLAPSED LUNG  1994 ?  Marland Kitchen RIGHT SHOULDER SURGERY TO SHAVE SOME BONE  2003 ?  Marland Kitchen RIGHT THUMB SURGERY  FOR CHRONIC DISLOCATION  1997 ?  . ROBOT ASSISTED LAPAROSCOPIC RADICAL PROSTATECTOMY Bilateral 01/23/2014   Procedure: ROBOTIC ASSISTED LAPAROSCOPIC RADICAL PROSTATECTOMY WITH BILATERAL NREVE SPARE;  Surgeon: Malka So, MD;  Location: [facility];  Service: Urology;  Laterality: Bilateral;    Family History History reviewed. No pertinent family history.   Social History  reports that he has been smoking cigarettes.  He has a 8.75 pack-year smoking history. he has never used smokeless tobacco. He reports  that he does not drink alcohol or use drugs.  Medications  Current Outpatient Medications:  .  olmesartan (BENICAR) 20 MG tablet, Take 20 mg by mouth every morning., Disp: , Rfl:  .  oxymetazoline (AFRIN) 0.05 % nasal spray, Place 1 spray into both nostrils 2 (two) times daily as needed for congestion., Disp: , Rfl:   Allergies Gadolinium derivatives and Statins  Review of Systems Review of Systems - Oncology ROS as per HPI otherwise 12 point ROS is negative.   Physical  Exam  Vitals Wt Readings from Last 3 Encounters:  08/04/17 252 lb (114.3 kg)  05/07/16 250 lb (113.4 kg)  01/23/14 243 lb (110.2 kg)   Temp Readings from Last 3 Encounters:  08/04/17 98.4 F (36.9 C) (Oral)  05/07/16 97.9 F (36.6 C) (Oral)  01/24/14 98 F (36.7 C) (Oral)   BP Readings from Last 3 Encounters:  08/04/17 (!) 162/97  05/07/16 122/60  01/24/14 (!) 110/57   Pulse Readings from Last 3 Encounters:  08/04/17 67  05/07/16 92  01/24/14 72   Constitutional: Well-developed, well-nourished, and in no distress.   HENT:  Head: Normocephalic and atraumatic.  Mouth/Throat: No oropharyngeal exudate. Mucosa moist. Eyes: Pupils are equal, round, and reactive to light. Conjunctivae are normal. No scleral icterus.  Neck: Normal range of motion. Neck supple. No JVD present.  Cardiovascular: Normal rate, regular rhythm and normal heart sounds.  Exam reveals no gallop and no friction rub.   No murmur heard. Pulmonary/Chest: Effort normal and breath sounds normal. No respiratory distress. No wheezes.No rales.  Abdominal: Soft. Bowel sounds are normal. No distension. There is no tenderness. There is no guarding.  Musculoskeletal: No edema or tenderness.  Lymphadenopathy: No cervical, axillary  or supraclavicular adenopathy.  Neurological: Alert and oriented to person, place, and time. No cranial nerve deficit.  Skin: Skin is warm and dry. No rash noted. No erythema. No pallor.  Psychiatric: Affect and judgment normal.   Labs Appointment on 08/04/2017  Component Date Value Ref Range Status  . WBC 08/04/2017 8.1  4.0 - 10.5 K/uL Final  . RBC 08/04/2017 5.97* 4.22 - 5.81 MIL/uL Final  . Hemoglobin 08/04/2017 17.5* 13.0 - 17.0 g/dL Final  . HCT 08/04/2017 50.9  39.0 - 52.0 % Final  . MCV 08/04/2017 85.3  78.0 - 100.0 fL Final  . MCH 08/04/2017 29.3  26.0 - 34.0 pg Final  . MCHC 08/04/2017 34.4  30.0 - 36.0 g/dL Final  . RDW 08/04/2017 13.8  11.5 - 15.5 % Final  . Platelets  08/04/2017 181  150 - 400 K/uL Final  . Neutrophils Relative % 08/04/2017 51  % Final  . Neutro Abs 08/04/2017 4.2  1.7 - 7.7 K/uL Final  . Lymphocytes Relative 08/04/2017 38  % Final  . Lymphs Abs 08/04/2017 3.1  0.7 - 4.0 K/uL Final  . Monocytes Relative 08/04/2017 8  % Final  . Monocytes Absolute 08/04/2017 0.6  0.1 - 1.0 K/uL Final  . Eosinophils Relative 08/04/2017 2  % Final  . Eosinophils Absolute 08/04/2017 0.2  0.0 - 0.7 K/uL Final  . Basophils Relative 08/04/2017 1  % Final  . Basophils Absolute 08/04/2017 0.1  0.0 - 0.1 K/uL Final   Performed at [facility], [address] [place], [place] [address]  . Sodium 08/04/2017 136  135 - 145 mmol/L Final  . Potassium 08/04/2017 4.4  3.5 - 5.1 mmol/L Final  . Chloride 08/04/2017 102  101 - 111 mmol/L Final  . CO2  08/04/2017 23  22 - 32 mmol/L Final  . Glucose, Bld 08/04/2017 96  65 - 99 mg/dL Final  . BUN 08/04/2017 20  6 - 20 mg/dL Final  . Creatinine, Ser 08/04/2017 1.09  0.61 - 1.24 mg/dL Final  . Calcium 08/04/2017 9.6  8.9 - 10.3 mg/dL Final  . Total Protein 08/04/2017 7.5  6.5 - 8.1 g/dL Final  . Albumin 08/04/2017 4.3  3.5 - 5.0 g/dL Final  . AST 08/04/2017 32  15 - 41 U/L Final  . ALT 08/04/2017 59  17 - 63 U/L Final  . Alkaline Phosphatase 08/04/2017 74  38 - 126 U/L Final  . Total Bilirubin 08/04/2017 1.0  0.3 - 1.2 mg/dL Final  . GFR calc non Af Amer 08/04/2017 >60  >60 mL/min Final  . GFR calc Af Amer 08/04/2017 >60  >60 mL/min Final   Comment: (NOTE) The eGFR has been calculated using the CKD EPI equation. This calculation has not been validated in all clinical situations. eGFR's persistently <60 mL/min signify possible Chronic Kidney Disease.   Georgiann Hahn gap 08/04/2017 11  5 - 15 Final   Performed at [facility], [address] [place], [place] [address]  . LDH 08/04/2017 158  98 - 192 U/L Final   Performed at [facility], [address] [place], [place] [address]  . Ferritin 08/04/2017 259  24 - 336 ng/mL  Final   Performed at [facility], [place] [address], [place], [place] [address]  . Iron 08/04/2017 165  45 - 182 ug/dL Final  . TIBC 08/04/2017 393  250 - 450 ug/dL Final  . Saturation Ratios 08/04/2017 42* 17.9 - 39.5 % Final  . UIBC 08/04/2017 228  ug/dL Final   Performed at [facility], [place] [address], [place], [place] [address]     Pathology Orders Placed This Encounter  Procedures  . DG Chest 2 View    Standing Status:   Future    Number of Occurrences:   1    Standing Expiration Date:   08/04/2018    Order Specific Question:   Reason for Exam (SYMPTOM  OR DIAGNOSIS REQUIRED)    Answer:   Sob, smoking history    Order Specific Question:   Preferred imaging location?    Answer:   [facility]    Order Specific Question:   Radiology Contrast Protocol - do NOT remove file path    Answer:   [email or web address]  . CBC with Differential/Platelet    Standing Status:   Future    Number of Occurrences:   1    Standing Expiration Date:   08/04/2018  . Comprehensive metabolic panel    Standing Status:   Future    Number of Occurrences:   1    Standing Expiration Date:   08/04/2018  . Lactate dehydrogenase    Standing Status:   Future    Number of Occurrences:   1    Standing Expiration Date:   08/04/2018  . Ferritin    Standing Status:   Future    Number of Occurrences:   1    Standing Expiration Date:   08/04/2018  . JAK2 genotypr    Standing Status:   Future    Number of Occurrences:   1    Standing Expiration Date:   08/04/2018  . Iron and TIBC    Standing Status:   Future    Number of Occurrences:   1  Standing Expiration Date:   08/04/2018  . Transferrin Saturation  . Hemochromatosis DNA-PCR(c282y,h63d)     Zoila Shutter MD   Cc:  Dr.  Jeffie Pollock

## 2017-08-08 LAB — JAK2 GENOTYPR

## 2017-08-09 LAB — HEMOCHROMATOSIS DNA-PCR(C282Y,H63D)

## 2017-08-10 ENCOUNTER — Other Ambulatory Visit: Payer: Self-pay | Admitting: Urology

## 2017-08-10 DIAGNOSIS — N281 Cyst of kidney, acquired: Secondary | ICD-10-CM

## 2017-08-17 ENCOUNTER — Ambulatory Visit (HOSPITAL_COMMUNITY)
Admission: RE | Admit: 2017-08-17 | Discharge: 2017-08-17 | Disposition: A | Discharge status: Home / Self Care | Payer: BLUE CROSS/BLUE SHIELD | Source: Ambulatory Visit | Attending: Urology | Admitting: Urology

## 2017-08-17 DIAGNOSIS — N281 Cyst of kidney, acquired: Secondary | ICD-10-CM | POA: Diagnosis not present

## 2017-08-23 ENCOUNTER — Ambulatory Visit (HOSPITAL_COMMUNITY): Payer: BLUE CROSS/BLUE SHIELD | Admitting: Internal Medicine

## 2017-08-23 DIAGNOSIS — M25561 Pain in right knee: Secondary | ICD-10-CM | POA: Diagnosis not present

## 2017-08-23 DIAGNOSIS — M7541 Impingement syndrome of right shoulder: Secondary | ICD-10-CM | POA: Diagnosis not present

## 2017-08-23 DIAGNOSIS — M25511 Pain in right shoulder: Secondary | ICD-10-CM | POA: Diagnosis not present

## 2017-08-24 ENCOUNTER — Encounter (HOSPITAL_COMMUNITY): Payer: Self-pay | Admitting: Internal Medicine

## 2017-08-24 ENCOUNTER — Inpatient Hospital Stay (HOSPITAL_COMMUNITY): Payer: BLUE CROSS/BLUE SHIELD | Attending: Internal Medicine | Admitting: Internal Medicine

## 2017-08-24 VITALS — BP 140/89 | HR 100 | Temp 97.8°F | Resp 18 | Wt 258.2 lb

## 2017-08-24 DIAGNOSIS — D751 Secondary polycythemia: Secondary | ICD-10-CM | POA: Insufficient documentation

## 2017-08-24 DIAGNOSIS — F17210 Nicotine dependence, cigarettes, uncomplicated: Secondary | ICD-10-CM | POA: Insufficient documentation

## 2017-08-24 DIAGNOSIS — I10 Essential (primary) hypertension: Secondary | ICD-10-CM | POA: Diagnosis not present

## 2017-08-24 DIAGNOSIS — R911 Solitary pulmonary nodule: Secondary | ICD-10-CM | POA: Insufficient documentation

## 2017-08-24 DIAGNOSIS — C61 Malignant neoplasm of prostate: Secondary | ICD-10-CM | POA: Insufficient documentation

## 2017-08-24 NOTE — Patient Instructions (Signed)
[place] at [facility] Discharge Instructions   You were seen today by Dr. Zoila Shutter. She went over your recent lab results. For the most part your labs were good. She discussed genetics and hemochromatosis. She gave you handouts on this as well. She wants to do a therapeutic phlebotomy and then redraw your labs. Please continue to try to stop smoking. Return in 3 months for labs and follow up.    Thank you for choosing [place] at [facility] to provide your oncology and hematology care.  To afford each patient quality time with our provider, please arrive at least 15 minutes before your scheduled appointment time.    If you have a lab appointment with the [facility] please come in thru the  Main Entrance and check in at the main information desk  You need to re-schedule your appointment should you arrive 10 or more minutes late.  We strive to give you quality time with our providers, and arriving late affects you and other patients whose appointments are after yours.  Also, if you no show three or more times for appointments you may be dismissed from the clinic at the providers discretion.     Again, thank you for choosing [facility].  Our hope is that these requests will decrease the amount of time that you wait before being seen by our physicians.       _____________________________________________________________  Should you have questions after your visit to [facility], please contact our office at [phone] between the hours of 8:30 a.m. and 4:30 p.m.  Voicemails left after 4:30 p.m. will not be returned until the following business day.  For prescription refill requests, have your pharmacy contact our office.       Resources For Cancer Patients and their Caregivers ? American Cancer Society: Can assist with transportation, wigs, general needs, runs Look Good Feel Better.         872-210-5538 ? Cancer Care: Provides financial assistance, online support groups, medication/co-pay assistance.  1-800-813-HOPE (574) 831-5571) ? [facility] Assists [place] cancer patients and their families through emotional , educational and financial support.  440-706-3517 ? [place] DSS Where to apply for food stamps, Medicaid and utility assistance. (360)318-8383 ? [facility]: Transportation to medical appointments. (507)064-3504 ? Social Security Administration: May apply for disability if have a Stage IV cancer. 309-667-2294 610-161-4732 ? [employer], Disability and Transit Services: Assists with nutrition, care and transit needs. [place] Support Programs:   > Cancer Support Group  2nd Tuesday of the month 1pm-2pm, Journey Room   > Creative Journey  3rd Tuesday of the month 1130am-1pm, Journey Room

## 2017-08-24 NOTE — Progress Notes (Signed)
Diagnosis Polycythemia, secondary - Plan: CBC with Differential/Platelet, Comprehensive metabolic panel, Lactate dehydrogenase, Ferritin, Iron and TIBC, Transferrin Saturation  Staging Cancer Staging No matching staging information was found for the patient.  Assessment and Plan:  1. Secondary Polycythemia.  Labs performed 08/04/2017 show a  hematocrit of 51.  His iron studies show a normal ferritin of 259.  Percent saturation is slightly elevated at 42.  Hemochromatosis gene evaluation shows he has a single copy of H63D and should be considered an unaffected carrier.  His father died of a massive heart attack and also had cirrhosis.  The patient shows evidence of some darkening of the skin.  He will undergo 1 unit phlebotomy of 500 mL.  He should  not need to have ongoing phlebotomy.    He will return to clinic in 3 months for repeat labs.  I have provided him written information concerning hemochromatosis and the unaffected carrier state.  I have discussed with him that based on his gene evaluation he should not develop signs or symptoms consistent with hemochromatosis.  I have reiterated to him that  likely etiology of his polycythemia is likely multifactorial but is also likely due to his history of smoking.  Chest x-ray done 08/04/2017 was negative.    2.  Smoking.  Smoking cessation is recommended.  He has undergone evaluation for pulmonary nodules in the past.  He was previously referred for lung cancer screening program evaluation.  3.  Pulmonary nodule.  Patient has undergone evaluation for this in the past.  He was referred to the lung cancer screening program for evaluation.  Recent CXR done 08/04/2017 was negative.    4.  Prostate cancer.  He is followed by urology.  5.  Hypertension.  Blood pressure is 140/89.  Follow-up with PCP.    Interval History:  57 year old male who is followed by urology.  He is receiving testosterone injections at their office and was noted to have a  hematocrit of 46.5.  He reports a history of smoking.  He denies any family history of liver issues.  Has noted no skin changes.  He reports some left leg neuropathy and occasional back pain.  Patient is seen today for consultation due to suspected secondary polycythemia.  Current Status:  Pt is seen today for follow-up.  He is here today to go over his lab studies.  When questioned he reports his father died of a massive heart attack and had liver cirrhosis.  Problem List Patient Active Problem List   Diagnosis Date Noted  . Malignant neoplasm of prostate ([place]) [C61] 01/23/2014  . Radicular low back pain [M54.10] 08/07/2013  . PULMONARY NODULE, RIGHT LOWER LOBE [J98.4] 07/06/2010  . HYPERLIPIDEMIA [E78.5] 07/03/2010  . HYPERTENSION [I10] 07/03/2010  . ECZEMA [L25.9] 07/03/2010    Past Medical History Past Medical History:  Diagnosis Date  . Anxiety   . Arthritis    HANDS AND EVERYWHERE  . Cancer [facility])    PROSTATE CANCER  . Hearing loss    hearling loss to left ear, suddenly  . Hypertension   . Pain    PT STATES RUPTURED DISC - LUMBAR - SOME LOWER BACK, RIGHT HIP, RIGHT LEG PAIN - NUMBNESS WITH PROLONGED STATNDING  . Pneumonia    YEARS AGO  . Ruptured lumbar disc    PT STATES HE HAS RUPTURED DISC - LUMBAR THAT SOMETIMES CAUSES RT HIP AND RT LEG PAIN AND NUMBNESS WITH PROLONGED STANDING.    Past Surgical History Past Surgical History:  Procedure Laterality Date  . JOINT REPLACEMENT     LEFT TOTAL KNEE REPLACEMENT  2010  . LUNG SURGERY FOR COLLAPSED LUNG  1994 ?  Marland Kitchen RIGHT SHOULDER SURGERY TO SHAVE SOME BONE  2003 ?  Marland Kitchen RIGHT THUMB SURGERY  FOR CHRONIC DISLOCATION  1997 ?  . ROBOT ASSISTED LAPAROSCOPIC RADICAL PROSTATECTOMY Bilateral 01/23/2014   Procedure: ROBOTIC ASSISTED LAPAROSCOPIC RADICAL PROSTATECTOMY WITH BILATERAL NREVE SPARE;  Surgeon: Malka So, MD;  Location: [facility]S;  Service: Urology;  Laterality: Bilateral;    Family History History reviewed. No pertinent  family history.   Social History  reports that he has been smoking cigarettes.  He has a 8.75 pack-year smoking history. he has never used smokeless tobacco. He reports that he does not drink alcohol or use drugs.  Medications  Current Outpatient Medications:  .  olmesartan (BENICAR) 20 MG tablet, Take 20 mg by mouth every morning., Disp: , Rfl:   Allergies Gadolinium derivatives and Statins  Review of Systems Review of Systems - Oncology ROS as per HPI otherwise 12 point ROS is negative.   Physical Exam  Vitals Wt Readings from Last 3 Encounters:  08/24/17 258 lb 3.2 oz (117.1 kg)  08/04/17 252 lb (114.3 kg)  05/07/16 250 lb (113.4 kg)   Temp Readings from Last 3 Encounters:  08/24/17 97.8 F (36.6 C) (Oral)  08/04/17 98.4 F (36.9 C) (Oral)  05/07/16 97.9 F (36.6 C) (Oral)   BP Readings from Last 3 Encounters:  08/24/17 140/89  08/04/17 (!) 162/97  05/07/16 122/60   Pulse Readings from Last 3 Encounters:  08/24/17 100  08/04/17 67  05/07/16 92    Constitutional: Well-developed, well-nourished, and in no distress.   HENT: Head: Normocephalic and atraumatic.  Mouth/Throat: No oropharyngeal exudate. Mucosa moist. Eyes: Pupils are equal, round, and reactive to light. Conjunctivae are normal. No scleral icterus.  Neck: Normal range of motion. Neck supple. No JVD present.  Cardiovascular: Normal rate, regular rhythm and normal heart sounds.  Exam reveals no gallop and no friction rub.   No murmur heard. Pulmonary/Chest: Effort normal and breath sounds normal. No respiratory distress. No wheezes.No rales.  Abdominal: Soft. Bowel sounds are normal. No distension. There is no tenderness. There is no guarding.  Musculoskeletal: No edema or tenderness.  Lymphadenopathy: No cervical, axillary or supraclavicular adenopathy.  Neurological: Alert and oriented to person, place, and time. No cranial nerve deficit.  Skin: Skin is warm and dry. No rash noted. No erythema. No  pallor. Darkening of skin noted.   Psychiatric: Affect and judgment normal.   Labs  08/04/2017:  Hemochromatosis gene evaluation shows he has a single copy of H63D and should be considered an unaffected carrier.   No visits with results within 3 Day(s) from this visit.  Latest known visit with results is:  Appointment on 08/04/2017  Component Date Value Ref Range Status  . WBC 08/04/2017 8.1  4.0 - 10.5 K/uL Final  . RBC 08/04/2017 5.97* 4.22 - 5.81 MIL/uL Final  . Hemoglobin 08/04/2017 17.5* 13.0 - 17.0 g/dL Final  . HCT 08/04/2017 50.9  39.0 - 52.0 % Final  . MCV 08/04/2017 85.3  78.0 - 100.0 fL Final  . MCH 08/04/2017 29.3  26.0 - 34.0 pg Final  . MCHC 08/04/2017 34.4  30.0 - 36.0 g/dL Final  . RDW 08/04/2017 13.8  11.5 - 15.5 % Final  . Platelets 08/04/2017 181  150 - 400 K/uL Final  . Neutrophils Relative % 08/04/2017 51  %  Final  . Neutro Abs 08/04/2017 4.2  1.7 - 7.7 K/uL Final  . Lymphocytes Relative 08/04/2017 38  % Final  . Lymphs Abs 08/04/2017 3.1  0.7 - 4.0 K/uL Final  . Monocytes Relative 08/04/2017 8  % Final  . Monocytes Absolute 08/04/2017 0.6  0.1 - 1.0 K/uL Final  . Eosinophils Relative 08/04/2017 2  % Final  . Eosinophils Absolute 08/04/2017 0.2  0.0 - 0.7 K/uL Final  . Basophils Relative 08/04/2017 1  % Final  . Basophils Absolute 08/04/2017 0.1  0.0 - 0.1 K/uL Final   Performed at [facility], [address], [place], [place] [address]  . Sodium 08/04/2017 136  135 - 145 mmol/L Final  . Potassium 08/04/2017 4.4  3.5 - 5.1 mmol/L Final  . Chloride 08/04/2017 102  101 - 111 mmol/L Final  . CO2 08/04/2017 23  22 - 32 mmol/L Final  . Glucose, Bld 08/04/2017 96  65 - 99 mg/dL Final  . BUN 08/04/2017 20  6 - 20 mg/dL Final  . Creatinine, Ser 08/04/2017 1.09  0.61 - 1.24 mg/dL Final  . Calcium 08/04/2017 9.6  8.9 - 10.3 mg/dL Final  . Total Protein 08/04/2017 7.5  6.5 - 8.1 g/dL Final  . Albumin 08/04/2017 4.3  3.5 - 5.0 g/dL Final  . AST 08/04/2017 32  15 - 41  U/L Final  . ALT 08/04/2017 59  17 - 63 U/L Final  . Alkaline Phosphatase 08/04/2017 74  38 - 126 U/L Final  . Total Bilirubin 08/04/2017 1.0  0.3 - 1.2 mg/dL Final  . GFR calc non Af Amer 08/04/2017 >60  >60 mL/min Final  . GFR calc Af Amer 08/04/2017 >60  >60 mL/min Final   Comment: (NOTE) The eGFR has been calculated using the CKD EPI equation. This calculation has not been validated in all clinical situations. eGFR's persistently <60 mL/min signify possible Chronic Kidney Disease.   Georgiann Hahn gap 08/04/2017 11  5 - 15 Final   Performed at [facility], [address], [place], [place] [address]  . LDH 08/04/2017 158  98 - 192 U/L Final   Performed at [facility], [address] [place],  [address]  . Ferritin 08/04/2017 259  24 - 336 ng/mL Final   Performed at [facility], [place] [address], [place],  [address]  . JAK2 GenotypR 08/04/2017 Comment   Final   Comment: (NOTE) Result: NEGATIVE for the JAK2 V617F mutation. Interpretation:  The G to T nucleotide change encoding the V617F mutation was not detected.  This result does not rule out the presence of the JAK2 mutation at a level below the sensitivity of detection of this assay, or the presence of other mutations within JAK2 not detected by this assay.  This result does not rule out a diagnosis of polycythemia vera, essential thrombocythemia or idiopathic myelofibrosis as the V617F mutation is not detected in all patients with these disorders.   . Director Review, JAK2 08/04/2017 Comment   Corrected   Comment: (NOTE) Constance Goltz, PhD, [facility]               Director, [place] for [place], [place]  [phone] This test was developed and its performance characteristics determined by [facility]. It has not been cleared or approved by the Food and Drug Administration. Performed At: [facility] [address] [place], [place] [id number] Nechama Guard MD OX:[id number] Performed At: [facility] [place], [place] [id number] Nechama Guard MD [facility]:[id number]   . BACKGROUND: 08/04/2017 Comment   Corrected   Comment: (NOTE) JAK2 is a cytoplasmic tyrosine kinase with a key role in signal transduction from multiple hematopoietic growth factor receptors. A point mutation within exon 14 of the JAK2 gene ([id number]) encoding a valine to phenylalanine substitution at position 617 of the JAK2 protein (V617F) has been identified in most patients with polycythemia vera, and in about half of those with either essential thrombocythemia or idiopathic myelofibrosis. The V617F has also been detected, although infrequently, in other myeloid disorders such as chronic myelomonocytic leukemia and chronic neutrophilic luekemia. V617F is an acquired mutation that alters a highly conserved valine present in the negative regulatory JH2 domain of the JAK2 protein and is predicted to dysregulate kinase activity. Methodology: Total genomic DNA was extracted and subjected to TaqMan real-time PCR amplification/detection. Two amplification products per sample were monitored by real-time PCR using primers/probes s                          pecific to JAK2 wild type (WT) and JAK2 mutant V617F. The ABI7900 Absolute Quantitation software will compare the patient specimen valuse to the standard curves and generate percent values for wild type and mutant type. In vitro studies have indicated that this assay has an analytical sensitivity of 1%. References: Baxter EJ, Scott Phineas Real, et al. Acquired mutation of the tyrosine kinase JAK2 in human myeloproliferative disorders. Lancet. 2005 Mar 19-25; 365(9464):1054-1061. Alfonso Ramus Couedic JP. A unique clonal JAK2 mutation leading to constitutive signaling causes polycythaemia vera. Nature. 2005 Apr 28;  434(7037):1144-1148. Kralovics R, Passamonti F, Buser AS, et al. A gain-of-function mutation of JAK2 in myeloproliferative disorders. N Engl J Med. 2005 Apr 28; 352(17):1779-1790. Performed at [facility], [address] [place], [place] [address]   . Iron 08/04/2017 165  45 - 182 ug/dL Final  . TIBC 08/04/2017 393  250 - 450 ug/dL Final  . Saturation Ratios 08/04/2017 42* 17.9 - 39.5 % Final  . UIBC 08/04/2017 228  ug/dL Final   Performed at [facility], [place] [address], [place], [place] [address]     Pathology Orders Placed This Encounter  Procedures  . CBC with Differential/Platelet    Standing Status:   Future    Standing Expiration Date:   08/25/2018  . Comprehensive metabolic panel    Standing Status:   Future    Standing Expiration Date:   08/25/2018  . Lactate dehydrogenase    Standing Status:   Future    Standing Expiration Date:   08/25/2018  . Ferritin    Standing Status:   Future    Standing Expiration Date:   08/25/2018  . Iron and TIBC    Standing Status:   Future    Standing Expiration Date:   08/24/2018  . Transferrin Saturation    Standing Status:   Future    Standing Expiration Date:   08/25/2018       Zoila Shutter MD

## 2017-08-26 DIAGNOSIS — D751 Secondary polycythemia: Secondary | ICD-10-CM | POA: Diagnosis not present

## 2017-08-29 ENCOUNTER — Other Ambulatory Visit (HOSPITAL_COMMUNITY): Payer: Self-pay | Admitting: *Deleted

## 2017-08-29 ENCOUNTER — Encounter (HOSPITAL_COMMUNITY)
Admission: RE | Admit: 2017-08-29 | Discharge: 2017-08-29 | Disposition: A | Discharge status: Home / Self Care | Payer: BLUE CROSS/BLUE SHIELD | Source: Ambulatory Visit | Attending: Internal Medicine | Admitting: Internal Medicine

## 2017-08-29 NOTE — Progress Notes (Signed)
Roberto Dixon presents today for phlebotomy per MD orders. HGB/HCT:17.5/50.9 Phlebotomy procedure started at 1410 and ended at 1416. 20 oz removed. Patient tolerated procedure well. IV needle removed intact. Ginger-ale given to drink. Tolerated well. 08/29/17-1438-VSS. Voices no c/o at this time. Denies dizziness/feeling faint. D/C to home in good condition.

## 2017-10-12 DIAGNOSIS — I10 Essential (primary) hypertension: Secondary | ICD-10-CM | POA: Diagnosis not present

## 2017-10-12 DIAGNOSIS — J302 Other seasonal allergic rhinitis: Secondary | ICD-10-CM | POA: Diagnosis not present

## 2017-10-12 DIAGNOSIS — Z6831 Body mass index (BMI) 31.0-31.9, adult: Secondary | ICD-10-CM | POA: Diagnosis not present

## 2017-10-31 DIAGNOSIS — E291 Testicular hypofunction: Secondary | ICD-10-CM | POA: Diagnosis not present

## 2017-10-31 DIAGNOSIS — D751 Secondary polycythemia: Secondary | ICD-10-CM | POA: Diagnosis not present

## 2017-11-04 ENCOUNTER — Ambulatory Visit: Payer: BLUE CROSS/BLUE SHIELD | Admitting: Urology

## 2017-11-04 DIAGNOSIS — D751 Secondary polycythemia: Secondary | ICD-10-CM

## 2017-11-04 DIAGNOSIS — E291 Testicular hypofunction: Secondary | ICD-10-CM

## 2017-11-09 DIAGNOSIS — E291 Testicular hypofunction: Secondary | ICD-10-CM | POA: Diagnosis not present

## 2017-12-02 ENCOUNTER — Other Ambulatory Visit (HOSPITAL_COMMUNITY): Payer: BLUE CROSS/BLUE SHIELD

## 2017-12-02 ENCOUNTER — Ambulatory Visit (HOSPITAL_COMMUNITY): Payer: BLUE CROSS/BLUE SHIELD | Admitting: Hematology

## 2017-12-07 ENCOUNTER — Ambulatory Visit (HOSPITAL_COMMUNITY): Payer: BLUE CROSS/BLUE SHIELD | Admitting: Hematology

## 2018-02-17 DIAGNOSIS — E782 Mixed hyperlipidemia: Secondary | ICD-10-CM | POA: Diagnosis not present

## 2018-02-21 DIAGNOSIS — Z8546 Personal history of malignant neoplasm of prostate: Secondary | ICD-10-CM | POA: Diagnosis not present

## 2018-02-21 DIAGNOSIS — E291 Testicular hypofunction: Secondary | ICD-10-CM | POA: Diagnosis not present

## 2018-02-24 ENCOUNTER — Ambulatory Visit: Payer: BLUE CROSS/BLUE SHIELD | Admitting: Urology

## 2018-02-24 DIAGNOSIS — N5231 Erectile dysfunction following radical prostatectomy: Secondary | ICD-10-CM

## 2018-02-24 DIAGNOSIS — Z8546 Personal history of malignant neoplasm of prostate: Secondary | ICD-10-CM

## 2018-02-24 DIAGNOSIS — E291 Testicular hypofunction: Secondary | ICD-10-CM

## 2018-04-07 DIAGNOSIS — I469 Cardiac arrest, cause unspecified: Secondary | ICD-10-CM | POA: Diagnosis not present

## 2018-04-14 DEATH — deceased

## 2019-11-19 ENCOUNTER — Encounter: Payer: Self-pay | Admitting: Gastroenterology

## 2020-02-28 IMAGING — US US RENAL
1 series · 14 of 25 positions shown · non-contrast
Comparison: CT abdomen 12/04/2013

CLINICAL DATA: Renal cyst

EXAM:
RENAL / URINARY TRACT ULTRASOUND COMPLETE

[Series 1: us renal · 0.28mm/px · 14 of 50 slices shown]
[im 1/50]
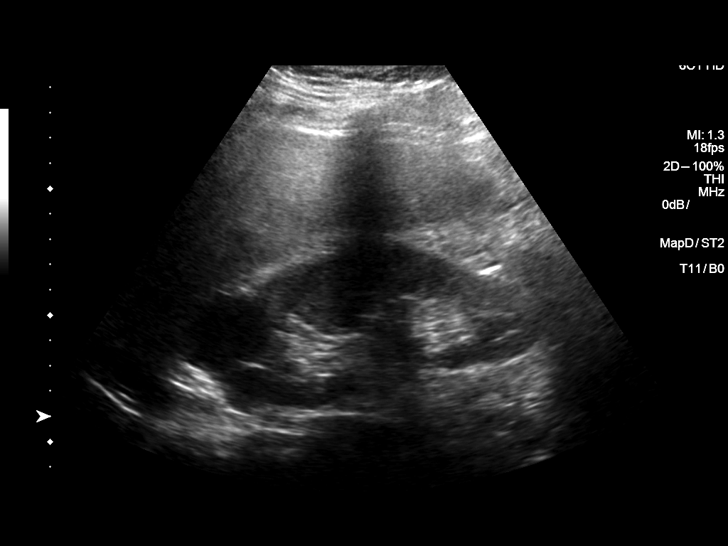
[im 5/50]
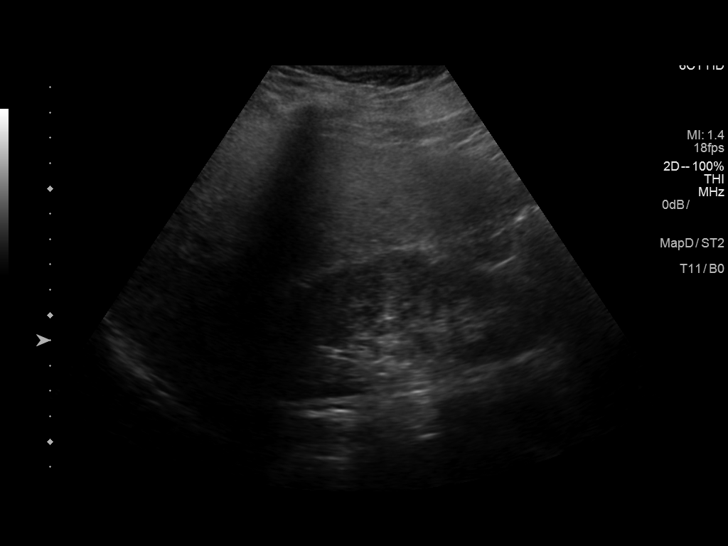
[im 9/50]
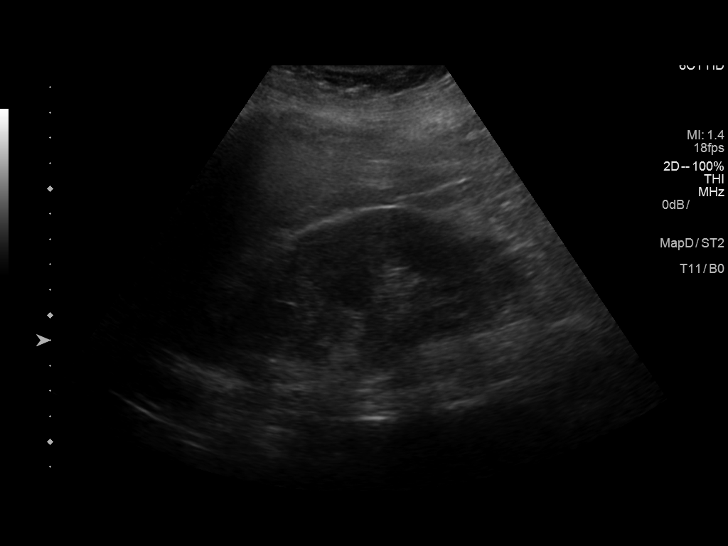
[im 13/50]
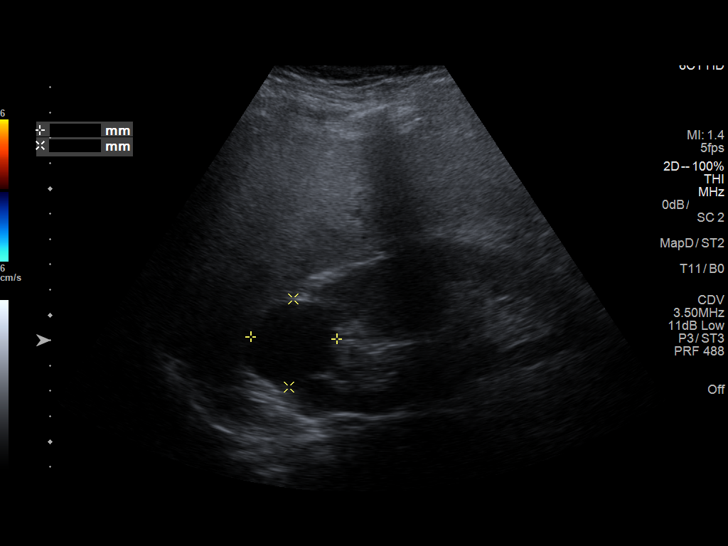
[im 17/50]
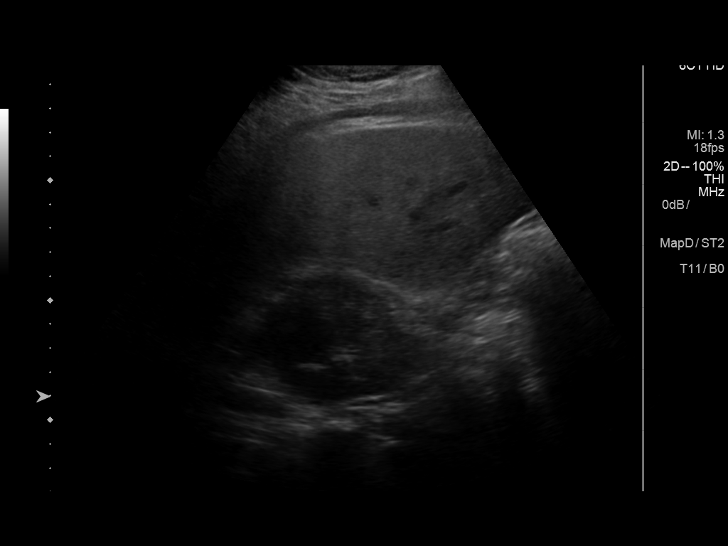
[im 19/50]
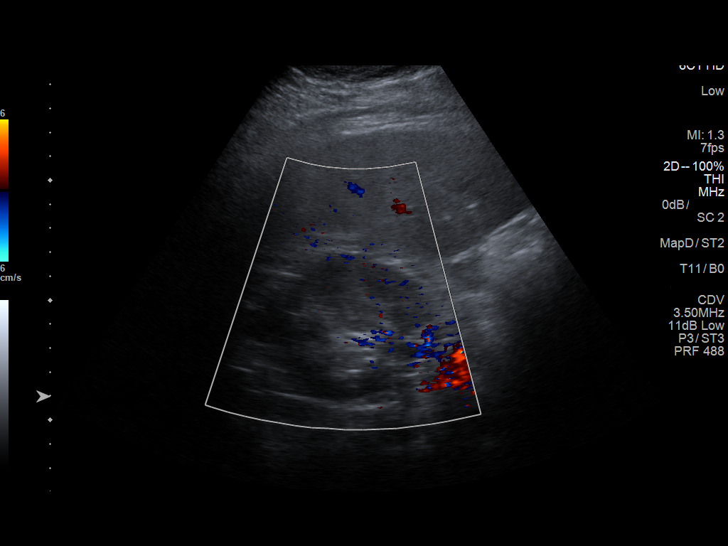
[im 23/50]
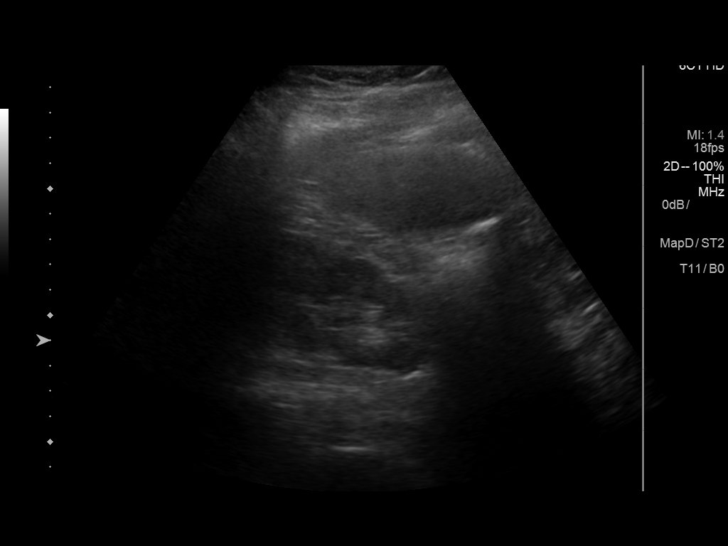
[im 27/50]
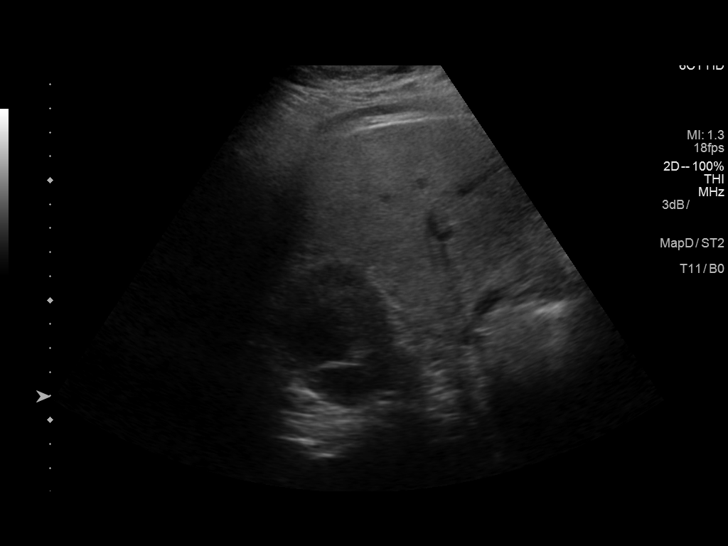
[im 31/50]
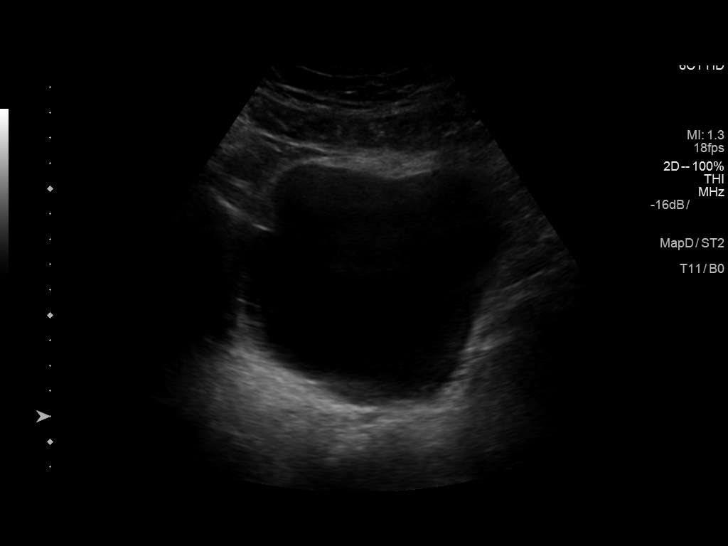
[im 33/50]
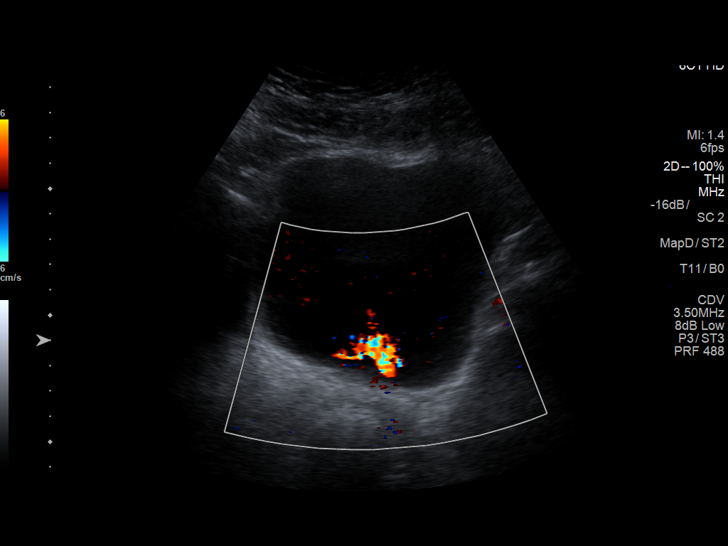
[im 37/50]
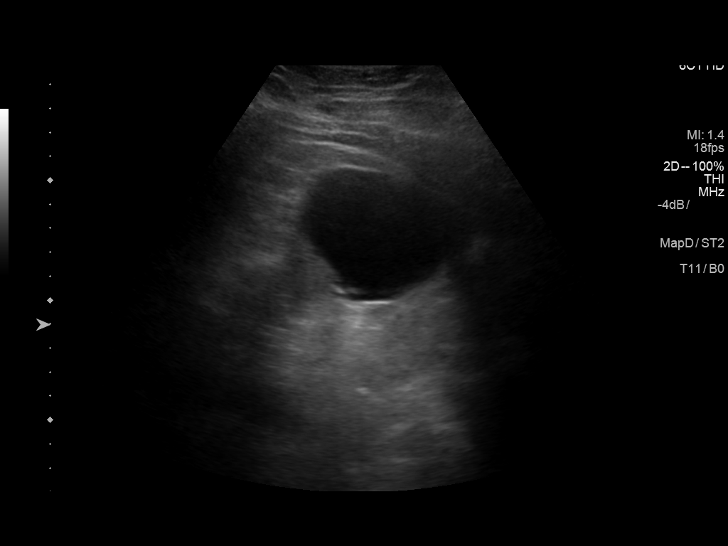
[im 41/50]
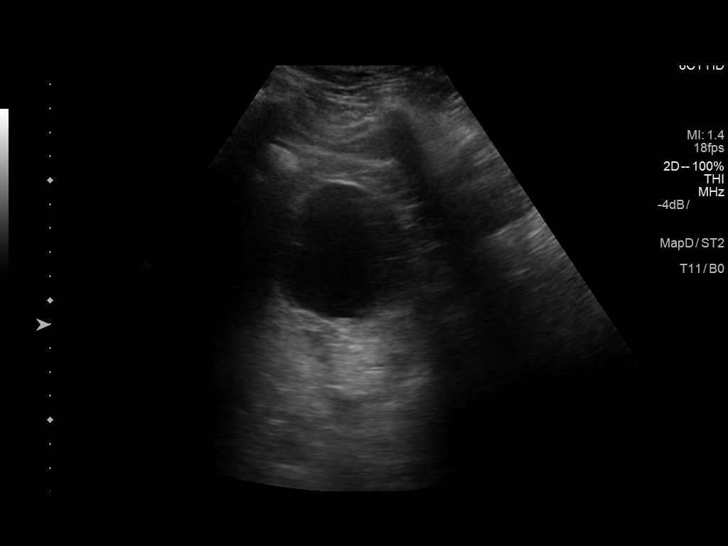
[im 45/50]
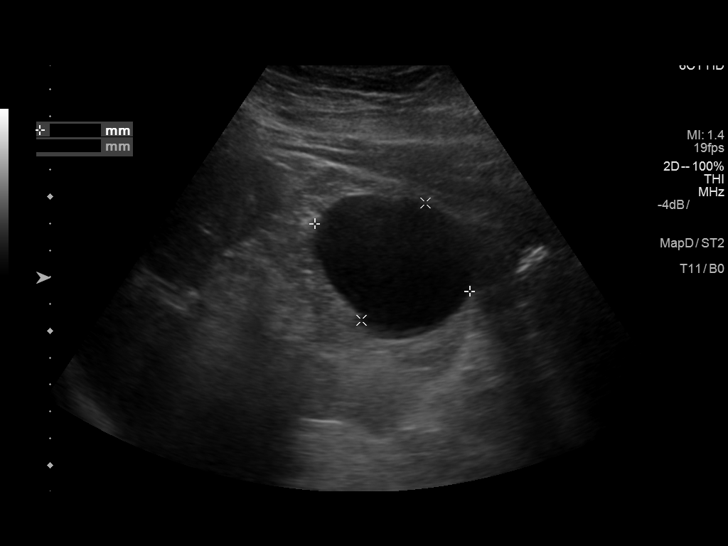
[im 50/50]
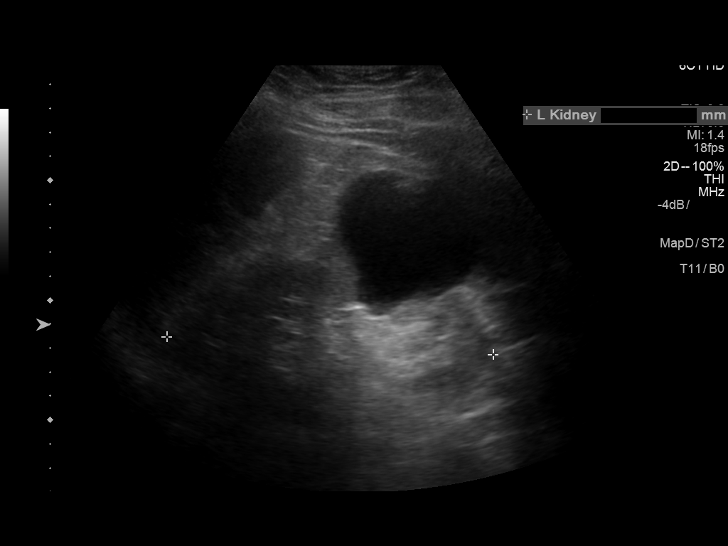

[14 of 25 positions shown; findings below may reference images not displayed]

FINDINGS: Right Kidney:

Length: 13.3 cm. 3.5 cm right upper pole cyst. 2 cm right upper pole
cyst.. Echogenicity within normal limits. No mass or hydronephrosis
visualized.

Left Kidney:

Length: 13.6 cm. 6 cm left midpole cyst, slightly complex with small
echogenic focus in the wall. No calcification seen on the prior CT
in the cyst.. Echogenicity within normal limits. No mass or
hydronephrosis visualized.

Bladder:

Bladder volume prevoid 273 mL. 0 mL postvoid volume. Bilateral
ureteral jets. No bladder mass.
IMPRESSION: Negative for obstruction.

Bilateral renal cysts with an overall benign appearance. Small
echogenic focus in the wall of the left renal cyst which may be
debris.
